# Patient Record
Sex: Male | Born: 1945 | Race: White | Hispanic: No | Marital: Single | Smoking: Never smoker
Health system: Southern US, Community
[De-identification: ages and names within clinical notes are randomized; demographics above are authoritative.]

## PROBLEM LIST (undated history)

## (undated) DIAGNOSIS — N400 Enlarged prostate without lower urinary tract symptoms: Secondary | ICD-10-CM

## (undated) DIAGNOSIS — M1990 Unspecified osteoarthritis, unspecified site: Secondary | ICD-10-CM

## (undated) DIAGNOSIS — I10 Essential (primary) hypertension: Secondary | ICD-10-CM

## (undated) DIAGNOSIS — M5126 Other intervertebral disc displacement, lumbar region: Secondary | ICD-10-CM

## (undated) DIAGNOSIS — R519 Headache, unspecified: Secondary | ICD-10-CM

## (undated) DIAGNOSIS — R51 Headache: Secondary | ICD-10-CM

## (undated) DIAGNOSIS — E7800 Pure hypercholesterolemia, unspecified: Secondary | ICD-10-CM

## (undated) DIAGNOSIS — K219 Gastro-esophageal reflux disease without esophagitis: Secondary | ICD-10-CM

## (undated) DIAGNOSIS — E039 Hypothyroidism, unspecified: Secondary | ICD-10-CM

## (undated) DIAGNOSIS — R0600 Dyspnea, unspecified: Secondary | ICD-10-CM

## (undated) HISTORY — PX: BUNIONECTOMY: SHX129

---

## 1992-11-10 HISTORY — PX: BACK SURGERY: SHX140

## 2009-11-10 HISTORY — PX: ROTATOR CUFF REPAIR: SHX139

## 2018-02-25 ENCOUNTER — Other Ambulatory Visit: Payer: Self-pay | Admitting: Neurosurgery

## 2018-03-01 NOTE — Pre-Procedure Instructions (Signed)
Nicholas LothLeon Gray Mason  03/01/2018      [facility] [id number] - [place], [place] - [address] [place] [address] [place] [id number] Phone: [phone] Fax: [phone]    Your procedure is scheduled on 03/12/2018.  [facility] at 0530 A.M.  Call this number if you have problems the morning of surgery:  [phone]   Remember:  Do not eat food or drink liquids after midnight.   Continue all medications as directed by your physician except follow these medication instructions before surgery below   Take these medicines the morning of surgery with A SIP OF WATER: Amlodipine (Norvasc) Baclofen (Lioresal) Levothyroxine (Synthroid) omemprazole (Prilosec) Oxycodone-acetaminophen (Percocet) - if needed  7 days prior to surgery STOP taking any diclofenac sodium (Voltaren) gel, Aspirin (unless otherwise instructed by your surgeon), Aleve, Naproxen, Ibuprofen, Motrin, Advil, Goody's, BC's, all herbal medications, fish oil, and all vitamins    Do not wear jewelry.  Do not wear lotions, powders, or colognes, or deodorant.  Men may shave face and neck.  Do not bring valuables to the hospital.  [facility] is not responsible for any belongings or valuables.  Hearing Aids, eyeglasses, contacts, dentures or bridgework may not be worn into surgery.  Leave your suitcase in the car.  After surgery it may be brought to your room.  For patients admitted to the hospital, discharge time will be determined by your treatment team.  Patients discharged the day of surgery will not be allowed to drive home.   Name and phone number of your driver:    Special instructions:   [facility]- Preparing For Surgery  Before surgery, you can play an important role. Because skin is not sterile, your skin needs to be as free of germs as possible. You can reduce the number of germs on your skin by washing with CHG (chlorahexidine gluconate)  Soap before surgery.  CHG is an antiseptic cleaner which kills germs and bonds with the skin to continue killing germs even after washing.  Please do not use if you have an allergy to CHG or antibacterial soaps. If your skin becomes reddened/irritated stop using the CHG.  Do not shave (including legs and underarms) for at least 48 hours prior to first CHG shower. It is OK to shave your face.  Please follow these instructions carefully.   1. Shower the NIGHT BEFORE SURGERY and the MORNING OF SURGERY with CHG.   2. If you chose to wash your hair, wash your hair first as usual with your normal shampoo.  3. After you shampoo, rinse your hair and body thoroughly to remove the shampoo.  4. Use CHG as you would any other liquid soap. You can apply CHG directly to the skin and wash gently with a scrungie or a clean washcloth.   5. Apply the CHG Soap to your body ONLY FROM THE NECK DOWN.  Do not use on open wounds or open sores. Avoid contact with your eyes, ears, mouth and genitals (private parts). Wash Face and genitals (private parts)  with your normal soap.  6. Wash thoroughly, paying special attention to the area where your surgery will be performed.  7. Thoroughly rinse your body with warm water from the neck down.  8. DO NOT shower/wash with your normal soap after using and rinsing off the CHG Soap.  9. Pat yourself dry with a CLEAN TOWEL.  10. Wear CLEAN PAJAMAS  to bed the night before surgery, wear comfortable clothes the morning of surgery  11. Place CLEAN SHEETS on your bed the night of your first shower and DO NOT SLEEP WITH PETS.    Day of Surgery: Shower as stated above. Do not apply any deodorants/lotions. Please wear clean clothes to the hospital/surgery center.      Please read over the following fact sheets that you were given.

## 2018-03-02 ENCOUNTER — Encounter (HOSPITAL_COMMUNITY)
Admission: RE | Admit: 2018-03-02 | Discharge: 2018-03-02 | Disposition: A | Discharge status: Home / Self Care | Payer: Worker's Compensation | Source: Ambulatory Visit | Attending: Neurosurgery | Admitting: Neurosurgery

## 2018-03-02 ENCOUNTER — Encounter (HOSPITAL_COMMUNITY): Payer: Self-pay

## 2018-03-02 ENCOUNTER — Other Ambulatory Visit: Payer: Self-pay

## 2018-03-02 DIAGNOSIS — Z01810 Encounter for preprocedural cardiovascular examination: Secondary | ICD-10-CM | POA: Insufficient documentation

## 2018-03-02 DIAGNOSIS — Z01812 Encounter for preprocedural laboratory examination: Secondary | ICD-10-CM | POA: Diagnosis not present

## 2018-03-02 DIAGNOSIS — I10 Essential (primary) hypertension: Secondary | ICD-10-CM | POA: Insufficient documentation

## 2018-03-02 HISTORY — DX: Hypothyroidism, unspecified: E03.9

## 2018-03-02 HISTORY — DX: Headache, unspecified: R51.9

## 2018-03-02 HISTORY — DX: Pure hypercholesterolemia, unspecified: E78.00

## 2018-03-02 HISTORY — DX: Headache: R51

## 2018-03-02 HISTORY — DX: Unspecified osteoarthritis, unspecified site: M19.90

## 2018-03-02 HISTORY — DX: Essential (primary) hypertension: I10

## 2018-03-02 HISTORY — DX: Gastro-esophageal reflux disease without esophagitis: K21.9

## 2018-03-02 LAB — TYPE AND SCREEN
ABO/RH(D): O POS
Antibody Screen: NEGATIVE

## 2018-03-02 LAB — BASIC METABOLIC PANEL
ANION GAP: 9 (ref 5–15)
BUN: 12 mg/dL (ref 6–20)
CALCIUM: 9.2 mg/dL (ref 8.9–10.3)
CO2: 25 mmol/L (ref 22–32)
CREATININE: 0.91 mg/dL (ref 0.61–1.24)
Chloride: 106 mmol/L (ref 101–111)
GFR calc Af Amer: >60 mL/min (ref >60)
GLUCOSE: 107 mg/dL — AB (ref 65–99)
Potassium: 4 mmol/L (ref 3.5–5.1)
Sodium: 140 mmol/L (ref 135–145)

## 2018-03-02 LAB — CBC
HCT: 42.3 % (ref 39.0–52.0)
HEMOGLOBIN: 13.7 g/dL (ref 13.0–17.0)
MCH: 28.7 pg (ref 26.0–34.0)
MCHC: 32.4 g/dL (ref 30.0–36.0)
MCV: 88.5 fL (ref 78.0–100.0)
PLATELETS: 271 10*3/uL (ref 150–400)
RBC: 4.78 MIL/uL (ref 4.22–5.81)
RDW: 13.6 % (ref 11.5–15.5)
WBC: 7 10*3/uL (ref 4.0–10.5)

## 2018-03-02 LAB — SURGICAL PCR SCREEN
MRSA, PCR: NEGATIVE
STAPHYLOCOCCUS AUREUS: NEGATIVE

## 2018-03-02 LAB — ABO/RH: ABO/RH(D): O POS

## 2018-03-02 NOTE — Progress Notes (Signed)
PCP - Dr. Eston [facility]ouldSolman at [facility] Cardiologist - patient denies  Chest x-ray - n/a EKG - 03/02/2018 Stress Test - patient unsure, states if he had one it was back in the early 1990's ECHO - patient unsure, states if he had one it was back in the early 1990's Cardiac Cath - patient denies  Sleep Study - patient denies  Blood Thinner Instructions: n/a Aspirin Instructions:n/a  Anesthesia review: n/a  Patient denies shortness of breath, fever, cough and chest pain at PAT appointment   Patient verbalized understanding of instructions that were given to them at the PAT appointment. Patient was also instructed that they will need to review over the PAT instructions again at home before surgery.

## 2018-03-11 NOTE — Anesthesia Preprocedure Evaluation (Addendum)
Anesthesia Evaluation  Patient identified by MRN, date of birth, ID band Patient awake    Reviewed: Allergy & Precautions, H&P , NPO status , Patient's Chart, lab work & pertinent test results  Airway Mallampati: III  TM Distance: >3 FB Neck ROM: Full    Dental no notable dental hx. (+) Edentulous Upper, Edentulous Lower, Dental Advisory Given   Pulmonary neg pulmonary ROS,    Pulmonary exam normal breath sounds clear to auscultation       Cardiovascular Exercise Tolerance: Good hypertension, Pt. on medications  Rhythm:Regular Rate:Normal     Neuro/Psych  Headaches, negative psych ROS   GI/Hepatic Neg liver ROS, GERD  Medicated and Controlled,  Endo/Other  Hypothyroidism Morbid obesity  Renal/GU negative Renal ROS  negative genitourinary   Musculoskeletal  (+) Arthritis , Osteoarthritis,    Abdominal   Peds  Hematology negative hematology ROS (+)   Anesthesia Other Findings   Reproductive/Obstetrics negative OB ROS                            Anesthesia Physical Anesthesia Plan  ASA: III  Anesthesia Plan: General   Post-op Pain Management:    Induction: Intravenous  PONV Risk Score and Plan: 3 and Ondansetron, Dexamethasone and Midazolam  Airway Management Planned: Oral ETT  Additional Equipment:   Intra-op Plan:   Post-operative Plan: Extubation in OR  Informed Consent: I have reviewed the patients History and Physical, chart, labs and discussed the procedure including the risks, benefits and alternatives for the proposed anesthesia with the patient or authorized representative who has indicated his/her understanding and acceptance.   Dental advisory given  Plan Discussed with: CRNA  Anesthesia Plan Comments:         Anesthesia Quick Evaluation

## 2018-03-12 ENCOUNTER — Encounter (HOSPITAL_COMMUNITY)
Admission: RE | Disposition: A | Discharge status: Home Health | Payer: Self-pay | Source: Ambulatory Visit | Attending: Neurosurgery

## 2018-03-12 ENCOUNTER — Other Ambulatory Visit: Payer: Self-pay

## 2018-03-12 ENCOUNTER — Inpatient Hospital Stay (HOSPITAL_COMMUNITY): Payer: Worker's Compensation | Admitting: Anesthesiology

## 2018-03-12 ENCOUNTER — Inpatient Hospital Stay (HOSPITAL_COMMUNITY): Payer: Worker's Compensation

## 2018-03-12 ENCOUNTER — Encounter (HOSPITAL_COMMUNITY): Payer: Self-pay

## 2018-03-12 ENCOUNTER — Inpatient Hospital Stay (HOSPITAL_COMMUNITY)
Admission: RE | Admit: 2018-03-12 | Discharge: 2018-03-15 | DRG: 455 | Disposition: A | Discharge status: Home Health | Payer: Worker's Compensation | Source: Ambulatory Visit | Attending: Neurosurgery | Admitting: Neurosurgery

## 2018-03-12 DIAGNOSIS — E7800 Pure hypercholesterolemia, unspecified: Secondary | ICD-10-CM | POA: Diagnosis present

## 2018-03-12 DIAGNOSIS — E039 Hypothyroidism, unspecified: Secondary | ICD-10-CM | POA: Diagnosis present

## 2018-03-12 DIAGNOSIS — M4316 Spondylolisthesis, lumbar region: Principal | ICD-10-CM

## 2018-03-12 DIAGNOSIS — K08109 Complete loss of teeth, unspecified cause, unspecified class: Secondary | ICD-10-CM | POA: Diagnosis present

## 2018-03-12 DIAGNOSIS — M1990 Unspecified osteoarthritis, unspecified site: Secondary | ICD-10-CM | POA: Diagnosis present

## 2018-03-12 DIAGNOSIS — Z79890 Hormone replacement therapy (postmenopausal): Secondary | ICD-10-CM | POA: Diagnosis not present

## 2018-03-12 DIAGNOSIS — M48062 Spinal stenosis, lumbar region with neurogenic claudication: Secondary | ICD-10-CM | POA: Diagnosis present

## 2018-03-12 DIAGNOSIS — K219 Gastro-esophageal reflux disease without esophagitis: Secondary | ICD-10-CM | POA: Diagnosis present

## 2018-03-12 DIAGNOSIS — I10 Essential (primary) hypertension: Secondary | ICD-10-CM | POA: Diagnosis present

## 2018-03-12 DIAGNOSIS — Z79899 Other long term (current) drug therapy: Secondary | ICD-10-CM | POA: Diagnosis not present

## 2018-03-12 DIAGNOSIS — Z419 Encounter for procedure for purposes other than remedying health state, unspecified: Secondary | ICD-10-CM

## 2018-03-12 HISTORY — DX: Other intervertebral disc displacement, lumbar region: M51.26

## 2018-03-12 SURGERY — POSTERIOR LUMBAR FUSION 1 LEVEL
Anesthesia: General | Site: Back

## 2018-03-12 MED ORDER — ACETAMINOPHEN 325 MG PO TABS
650.0000 mg | ORAL_TABLET | ORAL | Status: DC | PRN
  Filled 2018-03-12: qty 2

## 2018-03-12 MED ORDER — DEXAMETHASONE SODIUM PHOSPHATE 10 MG/ML IJ SOLN
INTRAMUSCULAR | Status: AC
  Administered 2018-03-12: 10 mg via INTRAVENOUS
  Filled 2018-03-12: qty 1

## 2018-03-12 MED ORDER — SODIUM CHLORIDE 0.9 % IV SOLN: 250.0000 mL | INTRAVENOUS | Status: DC

## 2018-03-12 MED ORDER — PHENOL 1.4 % MT LIQD: 1.0000 | OROMUCOSAL | Status: DC | PRN

## 2018-03-12 MED ORDER — SODIUM CHLORIDE 0.9% FLUSH
3.0000 mL | Freq: Two times a day (BID) | INTRAVENOUS | Status: DC
  Administered 2018-03-13 – 2018-03-15 (×3): 3 mL via INTRAVENOUS

## 2018-03-12 MED ORDER — ALUM & MAG HYDROXIDE-SIMETH 200-200-20 MG/5ML PO SUSP: 30.0000 mL | Freq: Four times a day (QID) | ORAL | Status: DC | PRN

## 2018-03-12 MED ORDER — HYDROMORPHONE HCL 2 MG/ML IJ SOLN
0.2500 mg | INTRAMUSCULAR | Status: DC | PRN
  Administered 2018-03-12 (×4): 0.5 mg via INTRAVENOUS

## 2018-03-12 MED ORDER — HYDROMORPHONE HCL 2 MG/ML IJ SOLN
INTRAMUSCULAR | Status: AC
  Administered 2018-03-12: 0.5 mg via INTRAVENOUS
  Filled 2018-03-12: qty 1

## 2018-03-12 MED ORDER — ONDANSETRON HCL 4 MG/2ML IJ SOLN
INTRAMUSCULAR | Status: AC
  Administered 2018-03-12: 4 mg via INTRAVENOUS
  Filled 2018-03-12: qty 2

## 2018-03-12 MED ORDER — ACETAMINOPHEN 10 MG/ML IV SOLN
1000.0000 mg | Freq: Once | INTRAVENOUS | Status: AC
  Administered 2018-03-12: 1000 mg via INTRAVENOUS

## 2018-03-12 MED ORDER — CYCLOBENZAPRINE HCL 10 MG PO TABS
ORAL_TABLET | ORAL | Status: AC
  Filled 2018-03-12: qty 1

## 2018-03-12 MED ORDER — ACETAMINOPHEN 650 MG RE SUPP: 650.0000 mg | RECTAL | Status: DC | PRN

## 2018-03-12 MED ORDER — OXYCODONE HCL 5 MG PO TABS
10.0000 mg | ORAL_TABLET | ORAL | Status: DC | PRN
  Administered 2018-03-12: 10 mg via ORAL

## 2018-03-12 MED ORDER — DEXAMETHASONE SODIUM PHOSPHATE 10 MG/ML IJ SOLN
INTRAMUSCULAR | Status: AC
  Filled 2018-03-12: qty 1

## 2018-03-12 MED ORDER — HYDROMORPHONE HCL 1 MG/ML IJ SOLN
INTRAMUSCULAR | Status: DC | PRN
  Administered 2018-03-12 (×2): .25 mg via INTRAVENOUS

## 2018-03-12 MED ORDER — ONDANSETRON HCL 4 MG/2ML IJ SOLN
INTRAMUSCULAR | Status: DC | PRN
  Administered 2018-03-12: 4 mg via INTRAVENOUS

## 2018-03-12 MED ORDER — ONDANSETRON HCL 4 MG PO TABS: 4.0000 mg | ORAL_TABLET | Freq: Four times a day (QID) | ORAL | Status: DC | PRN

## 2018-03-12 MED ORDER — FENTANYL CITRATE (PF) 100 MCG/2ML IJ SOLN
INTRAMUSCULAR | Status: DC | PRN
  Administered 2018-03-12: 100 ug via INTRAVENOUS
  Administered 2018-03-12 (×8): 50 ug via INTRAVENOUS

## 2018-03-12 MED ORDER — HYDROMORPHONE HCL 1 MG/ML IJ SOLN
INTRAMUSCULAR | Status: AC
  Filled 2018-03-12: qty 0.5

## 2018-03-12 MED ORDER — LIDOCAINE-EPINEPHRINE 1 %-1:100000 IJ SOLN
INTRAMUSCULAR | Status: AC
  Filled 2018-03-12: qty 1

## 2018-03-12 MED ORDER — FENTANYL CITRATE (PF) 250 MCG/5ML IJ SOLN
INTRAMUSCULAR | Status: AC
  Filled 2018-03-12: qty 5

## 2018-03-12 MED ORDER — MIDAZOLAM HCL 2 MG/2ML IJ SOLN
INTRAMUSCULAR | Status: AC
  Administered 2018-03-12: 1 mg via INTRAVENOUS
  Filled 2018-03-12: qty 2

## 2018-03-12 MED ORDER — 0.9 % SODIUM CHLORIDE (POUR BTL) OPTIME
TOPICAL | Status: DC | PRN
  Administered 2018-03-12 (×2): 1000 mL

## 2018-03-12 MED ORDER — LIDOCAINE HCL (CARDIAC) PF 100 MG/5ML IV SOSY
PREFILLED_SYRINGE | INTRAVENOUS | Status: DC | PRN
  Administered 2018-03-12: 60 mg via INTRAVENOUS

## 2018-03-12 MED ORDER — DEXAMETHASONE SODIUM PHOSPHATE 10 MG/ML IJ SOLN: 10.0000 mg | INTRAMUSCULAR | Status: DC

## 2018-03-12 MED ORDER — OXYCODONE HCL 5 MG PO TABS
15.0000 mg | ORAL_TABLET | ORAL | Status: DC | PRN
  Administered 2018-03-12 – 2018-03-15 (×12): 15 mg via ORAL
  Filled 2018-03-12 (×12): qty 3

## 2018-03-12 MED ORDER — DEXAMETHASONE SODIUM PHOSPHATE 10 MG/ML IJ SOLN
10.0000 mg | Freq: Once | INTRAMUSCULAR | Status: AC
  Administered 2018-03-12: 10 mg via INTRAVENOUS

## 2018-03-12 MED ORDER — ACETAMINOPHEN 10 MG/ML IV SOLN
INTRAVENOUS | Status: AC
  Filled 2018-03-12: qty 100

## 2018-03-12 MED ORDER — CHLORHEXIDINE GLUCONATE CLOTH 2 % EX PADS: 6.0000 | MEDICATED_PAD | Freq: Once | CUTANEOUS | Status: DC

## 2018-03-12 MED ORDER — CEFAZOLIN SODIUM-DEXTROSE 2-4 GM/100ML-% IV SOLN
2.0000 g | INTRAVENOUS | Status: AC
  Administered 2018-03-12: 2 g via INTRAVENOUS

## 2018-03-12 MED ORDER — BUPIVACAINE LIPOSOME 1.3 % IJ SUSP
20.0000 mL | INTRAMUSCULAR | Status: DC
  Filled 2018-03-12 (×2): qty 20

## 2018-03-12 MED ORDER — PROPOFOL 10 MG/ML IV BOLUS
INTRAVENOUS | Status: AC
  Filled 2018-03-12: qty 20

## 2018-03-12 MED ORDER — THROMBIN 20000 UNITS EX SOLR
CUTANEOUS | Status: AC
  Filled 2018-03-12: qty 20000

## 2018-03-12 MED ORDER — SODIUM CHLORIDE 0.9 % IV SOLN
INTRAVENOUS | Status: DC | PRN
  Administered 2018-03-12: 07:00:00

## 2018-03-12 MED ORDER — VANCOMYCIN HCL 1000 MG IV SOLR
INTRAVENOUS | Status: AC
  Filled 2018-03-12: qty 1000

## 2018-03-12 MED ORDER — ROCURONIUM BROMIDE 50 MG/5ML IV SOLN
INTRAVENOUS | Status: AC
  Filled 2018-03-12: qty 3

## 2018-03-12 MED ORDER — HYDROMORPHONE HCL 1 MG/ML IJ SOLN
1.0000 mg | INTRAMUSCULAR | Status: DC | PRN
  Administered 2018-03-12 (×2): 1 mg via INTRAVENOUS
  Filled 2018-03-12 (×2): qty 1

## 2018-03-12 MED ORDER — DEXAMETHASONE SODIUM PHOSPHATE 10 MG/ML IJ SOLN: 10.0000 mg | Freq: Four times a day (QID) | INTRAMUSCULAR | Status: DC

## 2018-03-12 MED ORDER — MIDAZOLAM HCL 2 MG/2ML IJ SOLN
INTRAMUSCULAR | Status: AC
  Filled 2018-03-12: qty 2

## 2018-03-12 MED ORDER — THROMBIN 5000 UNITS EX SOLR
CUTANEOUS | Status: AC
  Filled 2018-03-12: qty 5000

## 2018-03-12 MED ORDER — DEXAMETHASONE SODIUM PHOSPHATE 10 MG/ML IJ SOLN
INTRAMUSCULAR | Status: DC | PRN
  Administered 2018-03-12: 10 mg via INTRAVENOUS

## 2018-03-12 MED ORDER — MIDAZOLAM HCL 2 MG/2ML IJ SOLN
1.0000 mg | Freq: Once | INTRAMUSCULAR | Status: AC
  Administered 2018-03-12: 1 mg via INTRAVENOUS

## 2018-03-12 MED ORDER — ROCURONIUM BROMIDE 100 MG/10ML IV SOLN
INTRAVENOUS | Status: DC | PRN
  Administered 2018-03-12: 10 mg via INTRAVENOUS
  Administered 2018-03-12 (×2): 20 mg via INTRAVENOUS
  Administered 2018-03-12: 10 mg via INTRAVENOUS
  Administered 2018-03-12: 50 mg via INTRAVENOUS
  Administered 2018-03-12: 30 mg via INTRAVENOUS

## 2018-03-12 MED ORDER — METHOCARBAMOL 1000 MG/10ML IJ SOLN
500.0000 mg | Freq: Once | INTRAVENOUS | Status: AC
  Administered 2018-03-12: 500 mg via INTRAVENOUS
  Filled 2018-03-12: qty 5

## 2018-03-12 MED ORDER — CEFAZOLIN SODIUM-DEXTROSE 2-4 GM/100ML-% IV SOLN
2.0000 g | Freq: Three times a day (TID) | INTRAVENOUS | Status: AC
  Administered 2018-03-12 – 2018-03-14 (×6): 2 g via INTRAVENOUS
  Filled 2018-03-12 (×7): qty 100

## 2018-03-12 MED ORDER — SUGAMMADEX SODIUM 200 MG/2ML IV SOLN
INTRAVENOUS | Status: DC | PRN
  Administered 2018-03-12: 250 mg via INTRAVENOUS

## 2018-03-12 MED ORDER — ALBUMIN HUMAN 5 % IV SOLN
INTRAVENOUS | Status: DC | PRN
  Administered 2018-03-12: 11:00:00 via INTRAVENOUS

## 2018-03-12 MED ORDER — LIDOCAINE-EPINEPHRINE 1 %-1:100000 IJ SOLN
INTRAMUSCULAR | Status: DC | PRN
  Administered 2018-03-12: 9 mL

## 2018-03-12 MED ORDER — MENTHOL 3 MG MT LOZG: 1.0000 | LOZENGE | OROMUCOSAL | Status: DC | PRN

## 2018-03-12 MED ORDER — OXYCODONE HCL 5 MG PO TABS
ORAL_TABLET | ORAL | Status: AC
  Administered 2018-03-12: 15 mg via ORAL
  Filled 2018-03-12: qty 2

## 2018-03-12 MED ORDER — CYCLOBENZAPRINE HCL 10 MG PO TABS
10.0000 mg | ORAL_TABLET | Freq: Three times a day (TID) | ORAL | Status: DC | PRN
  Administered 2018-03-12: 10 mg via ORAL

## 2018-03-12 MED ORDER — PROPOFOL 10 MG/ML IV BOLUS
INTRAVENOUS | Status: DC | PRN
  Administered 2018-03-12: 100 mg via INTRAVENOUS

## 2018-03-12 MED ORDER — METHOCARBAMOL 1000 MG/10ML IJ SOLN
1000.0000 mg | Freq: Four times a day (QID) | INTRAMUSCULAR | Status: DC | PRN
  Administered 2018-03-14 (×2): 1000 mg via INTRAVENOUS
  Filled 2018-03-12 (×3): qty 10

## 2018-03-12 MED ORDER — LACTATED RINGERS IV SOLN
INTRAVENOUS | Status: DC | PRN
  Administered 2018-03-12 (×3): via INTRAVENOUS

## 2018-03-12 MED ORDER — FAMOTIDINE IN NACL 20-0.9 MG/50ML-% IV SOLN
20.0000 mg | Freq: Two times a day (BID) | INTRAVENOUS | Status: DC
  Administered 2018-03-12 – 2018-03-14 (×4): 20 mg via INTRAVENOUS
  Filled 2018-03-12 (×4): qty 50

## 2018-03-12 MED ORDER — ONDANSETRON HCL 4 MG/2ML IJ SOLN
4.0000 mg | Freq: Four times a day (QID) | INTRAMUSCULAR | Status: DC | PRN
  Administered 2018-03-12 – 2018-03-14 (×2): 4 mg via INTRAVENOUS
  Filled 2018-03-12: qty 2

## 2018-03-12 MED ORDER — THROMBIN (RECOMBINANT) 20000 UNITS EX SOLR
CUTANEOUS | Status: DC | PRN
  Administered 2018-03-12: 07:00:00 via TOPICAL

## 2018-03-12 MED ORDER — MIDAZOLAM HCL 5 MG/5ML IJ SOLN
INTRAMUSCULAR | Status: DC | PRN
  Administered 2018-03-12 (×2): 1 mg via INTRAVENOUS

## 2018-03-12 MED ORDER — CEFAZOLIN SODIUM-DEXTROSE 2-4 GM/100ML-% IV SOLN
INTRAVENOUS | Status: AC
  Filled 2018-03-12: qty 100

## 2018-03-12 MED ORDER — SODIUM CHLORIDE 0.9% FLUSH: 3.0000 mL | INTRAVENOUS | Status: DC | PRN

## 2018-03-12 MED ORDER — BUPIVACAINE HCL (PF) 0.25 % IJ SOLN
INTRAMUSCULAR | Status: AC
  Filled 2018-03-12: qty 30

## 2018-03-12 MED ORDER — VANCOMYCIN HCL 1 G IV SOLR
INTRAVENOUS | Status: DC | PRN
  Administered 2018-03-12: 1000 mg

## 2018-03-12 SURGICAL SUPPLY — 76 items
BAG DECANTER FOR FLEXI CONT (MISCELLANEOUS) ×3 IMPLANT
BENZOIN TINCTURE PRP APPL 2/3 (GAUZE/BANDAGES/DRESSINGS) ×3 IMPLANT
BLADE CLIPPER SURG (BLADE) IMPLANT
BLADE SURG 11 STRL SS (BLADE) ×3 IMPLANT
BONE VIVIGEN FORMABLE 5.4CC (Bone Implant) ×3 IMPLANT
BUR CUTTER 7.0 ROUND (BURR) ×3 IMPLANT
BUR MATCHSTICK NEURO 3.0 LAGG (BURR) ×3 IMPLANT
CAGE RISE 11-17-15 10X22 (Cage) ×6 IMPLANT
CANISTER SUCT 3000ML PPV (MISCELLANEOUS) ×3 IMPLANT
CAP LOCKING THREADED (Cap) ×12 IMPLANT
CARTRIDGE OIL MAESTRO DRILL (MISCELLANEOUS) ×1 IMPLANT
CLOSURE WOUND 1/2 X4 (GAUZE/BANDAGES/DRESSINGS) ×1
CONT SPEC 4OZ CLIKSEAL STRL BL (MISCELLANEOUS) ×3 IMPLANT
COVER BACK TABLE 60X90IN (DRAPES) ×3 IMPLANT
DECANTER SPIKE VIAL GLASS SM (MISCELLANEOUS) ×3 IMPLANT
DERMABOND ADVANCED (GAUZE/BANDAGES/DRESSINGS) ×2
DERMABOND ADVANCED .7 DNX12 (GAUZE/BANDAGES/DRESSINGS) ×1 IMPLANT
DIFFUSER DRILL AIR PNEUMATIC (MISCELLANEOUS) ×3 IMPLANT
DRAPE C-ARM 42X72 X-RAY (DRAPES) ×6 IMPLANT
DRAPE C-ARMOR (DRAPES) IMPLANT
DRAPE HALF SHEET 40X57 (DRAPES) IMPLANT
DRAPE LAPAROTOMY 100X72X124 (DRAPES) ×3 IMPLANT
DRAPE SURG 17X23 STRL (DRAPES) ×3 IMPLANT
DRSG OPSITE 4X5.5 SM (GAUZE/BANDAGES/DRESSINGS) ×3 IMPLANT
DRSG OPSITE POSTOP 4X6 (GAUZE/BANDAGES/DRESSINGS) ×3 IMPLANT
DURAPREP 26ML APPLICATOR (WOUND CARE) ×3 IMPLANT
ELECT BLADE 4.0 EZ CLEAN MEGAD (MISCELLANEOUS) ×3
ELECT REM PT RETURN 9FT ADLT (ELECTROSURGICAL) ×3
ELECTRODE BLDE 4.0 EZ CLN MEGD (MISCELLANEOUS) ×1 IMPLANT
ELECTRODE REM PT RTRN 9FT ADLT (ELECTROSURGICAL) ×1 IMPLANT
EVACUATOR 3/16  PVC DRAIN (DRAIN)
EVACUATOR 3/16 PVC DRAIN (DRAIN) IMPLANT
FIBER BOAT ALLOFUSE 7.5CC (Bone Implant) ×3 IMPLANT
GAUZE SPONGE 4X4 12PLY STRL (GAUZE/BANDAGES/DRESSINGS) ×3 IMPLANT
GAUZE SPONGE 4X4 16PLY XRAY LF (GAUZE/BANDAGES/DRESSINGS) ×3 IMPLANT
GLOVE BIO SURGEON STRL SZ7 (GLOVE) ×6 IMPLANT
GLOVE BIO SURGEON STRL SZ8 (GLOVE) ×6 IMPLANT
GLOVE BIOGEL PI IND STRL 6.5 (GLOVE) ×2 IMPLANT
GLOVE BIOGEL PI IND STRL 7.0 (GLOVE) IMPLANT
GLOVE BIOGEL PI INDICATOR 6.5 (GLOVE) ×4
GLOVE BIOGEL PI INDICATOR 7.0 (GLOVE)
GLOVE ECLIPSE 7.5 STRL STRAW (GLOVE) IMPLANT
GLOVE EXAM NITRILE LRG STRL (GLOVE) IMPLANT
GLOVE EXAM NITRILE XL STR (GLOVE) IMPLANT
GLOVE EXAM NITRILE XS STR PU (GLOVE) IMPLANT
GLOVE INDICATOR 8.5 STRL (GLOVE) ×6 IMPLANT
GLOVE SURG SS PI 6.0 STRL IVOR (GLOVE) ×9 IMPLANT
GOWN STRL REUS W/ TWL LRG LVL3 (GOWN DISPOSABLE) ×5 IMPLANT
GOWN STRL REUS W/ TWL XL LVL3 (GOWN DISPOSABLE) ×2 IMPLANT
GOWN STRL REUS W/TWL 2XL LVL3 (GOWN DISPOSABLE) IMPLANT
GOWN STRL REUS W/TWL LRG LVL3 (GOWN DISPOSABLE) ×10
GOWN STRL REUS W/TWL XL LVL3 (GOWN DISPOSABLE) ×4
HEMOSTAT POWDER KIT SURGIFOAM (HEMOSTASIS) IMPLANT
KIT BASIN OR (CUSTOM PROCEDURE TRAY) ×3 IMPLANT
KIT TURNOVER KIT B (KITS) ×3 IMPLANT
MILL MEDIUM DISP (BLADE) ×3 IMPLANT
NEEDLE HYPO 25X1 1.5 SAFETY (NEEDLE) ×3 IMPLANT
NS IRRIG 1000ML POUR BTL (IV SOLUTION) ×3 IMPLANT
OIL CARTRIDGE MAESTRO DRILL (MISCELLANEOUS) ×3
PACK LAMINECTOMY NEURO (CUSTOM PROCEDURE TRAY) ×3 IMPLANT
PAD ARMBOARD 7.5X6 YLW CONV (MISCELLANEOUS) ×9 IMPLANT
ROD 40MM SPINAL (Rod) ×6 IMPLANT
SCREW AMP MODULAR CREO 6.5X45 (Screw) ×6 IMPLANT
SCREW CREO 6.5X40 (Screw) ×6 IMPLANT
SCREW PA THRD CREO TULIP 5.5X4 (Head) ×12 IMPLANT
SPONGE LAP 4X18 X RAY DECT (DISPOSABLE) IMPLANT
SPONGE SURGIFOAM ABS GEL 100 (HEMOSTASIS) ×3 IMPLANT
STRIP CLOSURE SKIN 1/2X4 (GAUZE/BANDAGES/DRESSINGS) ×2 IMPLANT
SUT VIC AB 0 CT1 18XCR BRD8 (SUTURE) ×2 IMPLANT
SUT VIC AB 0 CT1 8-18 (SUTURE) ×4
SUT VIC AB 2-0 CT1 18 (SUTURE) ×3 IMPLANT
SUT VIC AB 4-0 PS2 27 (SUTURE) ×3 IMPLANT
TOWEL GREEN STERILE (TOWEL DISPOSABLE) ×3 IMPLANT
TOWEL GREEN STERILE FF (TOWEL DISPOSABLE) ×3 IMPLANT
TRAY FOLEY MTR SLVR 16FR STAT (SET/KITS/TRAYS/PACK) ×3 IMPLANT
WATER STERILE IRR 1000ML POUR (IV SOLUTION) ×3 IMPLANT

## 2018-03-12 NOTE — Anesthesia Procedure Notes (Signed)
Procedure Name: Intubation Date/Time: 03/12/2018 7:50 AM Performed by: Lanell Matar, CRNA Pre-anesthesia Checklist: Patient identified, Emergency Drugs available, Suction available and Patient being monitored Patient Re-evaluated:Patient Re-evaluated prior to induction Oxygen Delivery Method: Circle System Utilized Preoxygenation: Pre-oxygenation with 100% oxygen Induction Type: IV induction Ventilation: Mask ventilation without difficulty Laryngoscope Size: Miller and 2 Grade View: Grade I Tube type: Oral Number of attempts: 1 Airway Equipment and Method: Stylet and Oral airway Placement Confirmation: ETT inserted through vocal cords under direct vision,  positive ETCO2 and breath sounds checked- equal and bilateral Secured at: 21 cm Tube secured with: Tape Dental Injury: Teeth and Oropharynx as per pre-operative assessment

## 2018-03-12 NOTE — Progress Notes (Signed)
Patient arrived to unit with belongings and family at bedside.  Vitals stable, oriented to room and unit.  Admission completed.  Continue to monitor at this time.

## 2018-03-12 NOTE — Anesthesia Postprocedure Evaluation (Signed)
Anesthesia Post Note  Patient: Nicholas Mason  Procedure(s) Performed: Posterior Lumbar Interbody Fusion  - Lumbar four-Lumbar five (N/A Back)     Patient location during evaluation: PACU Anesthesia Type: General Level of consciousness: awake and alert Pain management: pain level controlled Vital Signs Assessment: post-procedure vital signs reviewed and stable Respiratory status: spontaneous breathing, nonlabored ventilation, respiratory function stable and patient connected to nasal cannula oxygen Cardiovascular status: blood pressure returned to baseline and stable Postop Assessment: no apparent nausea or vomiting Anesthetic complications: no    Last Vitals:  Vitals:   03/12/18 1408 03/12/18 1423  BP: 127/77 137/86  Pulse: 63 88  Resp: 11 (!) 22  Temp:    SpO2: 96% 96%    Last Pain:  Vitals:   03/12/18 1425  TempSrc:   PainSc: 10-Worst pain ever                 Gertha Lichtenberg,W. EDMOND

## 2018-03-12 NOTE — Transfer of Care (Signed)
Immediate Anesthesia Transfer of Care Note  Patient: Nicholas Mason  Procedure(s) Performed: Posterior Lumbar Interbody Fusion  - Lumbar four-Lumbar five (N/A Back)  Patient Location: PACU  Anesthesia Type:General  Level of Consciousness: awake, alert  and oriented  Airway & Oxygen Therapy: Patient Spontanous Breathing and Patient connected to nasal cannula oxygen  Post-op Assessment: Report given to RN, Post -op Vital signs reviewed and stable and Patient moving all extremities X 4  Post vital signs: Reviewed and stable  Last Vitals:  Vitals Value Taken Time  BP 164/111 03/12/2018 11:51 AM  Temp    Pulse 96 03/12/2018 11:53 AM  Resp 23 03/12/2018 11:53 AM  SpO2 95 % 03/12/2018 11:53 AM  Vitals shown include unvalidated device data.  Last Pain:  Vitals:   03/12/18 0607  TempSrc: Oral  PainSc: 0-No pain         Complications: No apparent anesthesia complications

## 2018-03-12 NOTE — Op Note (Signed)
Preoperative diagnosis: Grade 1 spondylolisthesis L4-5  Postoperative diagnosis: Same  Procedure: #1 Gill decompression L4-5 with complete medial facetectomies radical foraminotomies of the L4 and L5 nerve roots with drilling of partial inferior lateral aspect of the L4 pedicles decompression of the L4 nerve roots bilaterally. All this in excess and requiring more work to would be needed with a standard interbody fusion.  #2 posterior lumbar interbody fusion L4-5 utilizing the globus rise expandable cage system packed with locally harvested autograft mixed with vivigen and allostem cortical fiber boats  #3 pedicle screw fixation L4-5 utilizing the globus creole modular pedicle screw set  #4 posterior lateral arthrodesis L4-5 utilizing the locally harvested autograft mix along with cortical fiber boats  #5 open reduction spinal deformity L4-5  Surgeon: Jillyn Hidden Deontre Allsup  Asst.: Verlin Dike  Anesthesia: Gen.  EBL: Minimal  History of present illness: 72 year old male with long same back and bilateral leg pain worse on the right workup revealed severe bone-on-bone degenerative collapse grade 1 almost grade 2 spondylolisthesis with severe foraminal stenosis the L4 and L5 nerve roots. Due to patient's failure conservative treatment imaging findings and progressive clinical syndrome or recommended decompression stabilization procedure at L4-5. I extensively reviewed the risks and benefits of the operation with him as well as perioperative course expectations of outcome and alternatives of surgery and he understood and agreed to proceed forward.  Operative procedure: Patient brought into the or was induced under general anesthesia positioned prone the Wilson frame his back was prepped and draped in routine sterile fashion a midline incision was made after infiltration of 9 mL lidocaine with epi and Bovie light car was used to take down the subcutaneous tissues and subperiosteal dissections care lamina  of L4 and L5 bilaterally exposing TPs at L4 and L5 bilaterally. Interoperative x-ray confirmed good condition proper level there was marked facet arthropathy and extra facet synovial cyst. This is all removed spinous processes and removed central decompression was begun with a high-speed drill and a 3 mm Kerrison punch. Ligament flavum was identified and removed in piecemeal fashion. Complete medial facetectomies were performed and this allowed identification of the L5 pedicles and aggressive undercutting the superior tickling facet marching superiorly identify the L4 pedicle drilled off the inferolateral aspect of both L4 pedicles and aggressively decompressed the L4 nerve root in the foramen. After adequate facetectomies and foraminotomy was were achieved attention to the interbody work utilizing bipolar light cautery epidural veins currently disc space was incised bilaterally sequential distraction starting with a 5 mm distractor working up to 11 open up the disc space and significantly reduce the deformity. This also indirectly decompressed the 4 roots additionally. Then with 11 distractor in place endplates were prepared disc space was cleaned out and 1117 expandable cage with 15 lordosis was packed with local autograft mix and inserted and expanded. Then the distractor was removed in its similar fashion the central disc and contralateral side was prepared end plates were prepared autograft mix was packed centrally contralateral cage was inserted and both cages were sequentially expanded additionally to further reduce the deformity. After about 75% reduction in the slip was achieved attention taken to pedicle screw placement utilizing a high-speed drill pilot holes were drilled at L4 and L5 bilaterally pedicles were cannulated probed tapped with a 55 Probed again and a 6 5 x 45 screws inserted L4 bilaterally and 6 5 x 40 at L5 bilaterally. All screws excellent purchase and fluoroscopy confirmed good position  of all implants. Wounds and to see  her get meticulous in space was maintained Gelfoam was laid top of the dura the heads were reassembled and rods were placed everything was anchored in place the TPs and lateral facet complexes were aggressively decorticated and the local autograft mix along with cortical fiber boats were packed posterior laterally. Then all the foraminal reinspected confirm patency no migration of graft material Gelfoam was overlaid vancomycin sprout or was present: The wound. X Burrell was injected in the fascia. And the wounds closed with interrupted Vicryl running 4 subcuticular Dermabond benzo and Steri-Strips and sterile dressing. At the end the case all needle counts sponge counts were correct.

## 2018-03-12 NOTE — H&P (Signed)
Nicholas Mason is an 72 y.o. male.   Chief Complaint: back and right leg pain HPI: 72 year old gentleman with progressive worsening back and right leg pain and neurogenic claudication weakness. Workup has revealed a grade 1 spondylolisthesis with severe stenosis and at L4-5. Due to patient's progression of clinical syndrome imaging findings with a conservative treatment I recommended decompression stabilization procedure at L4-5. I extensively reviewed the risks and benefits of the operation with him as well as perioperative course expectations of outcome and alternatives of surgery and he understood and agreed to proceed forward.  Past Medical History:  Diagnosis Date  . Arthritis   . GERD (gastroesophageal reflux disease)   . Headache    "regular headache every now and then"  . High cholesterol   . HNP (herniated nucleus pulposus), lumbar   . Hypertension   . Hypothyroidism     Past Surgical History:  Procedure Laterality Date  . BACK SURGERY  1994   "middle back"  . BUNIONECTOMY Left   . ROTATOR CUFF REPAIR Left 2011    History reviewed. No pertinent family history. Social History:  reports that he has never smoked. He has never used smokeless tobacco. He reports that he drinks alcohol. He reports that he does not use drugs.  Allergies: No Known Allergies  Medications Prior to Admission  Medication Sig Dispense Refill  . amLODipine (NORVASC) 10 MG tablet Take 10 mg by mouth daily.    . baclofen (LIORESAL) 10 MG tablet Take 10 mg by mouth 4 (four) times daily.  2  . diclofenac sodium (VOLTAREN) 1 % GEL Apply 1 g topically 4 (four) times daily as needed for pain.  2  . ibuprofen (ADVIL,MOTRIN) 200 MG tablet Take 200-400 mg by mouth every 8 (eight) hours as needed (FOR PAIN.).    Marland Kitchen levothyroxine (SYNTHROID, LEVOTHROID) 125 MCG tablet Take 125 mcg by mouth daily before breakfast.    . lisinopril (PRINIVIL,ZESTRIL) 40 MG tablet Take 40 mg by mouth daily.    Marland Kitchen omeprazole  (PRILOSEC) 20 MG capsule Take 20 mg by mouth daily.    Marland Kitchen oxyCODONE-acetaminophen (PERCOCET) 7.5-325 MG tablet Take 1 tablet by mouth every 6 (six) hours as needed for pain.  0  . pravastatin (PRAVACHOL) 40 MG tablet Take 40 mg by mouth at bedtime.      No results found for this or any previous visit (from the past 48 hour(s)). No results found.  Review of Systems  Musculoskeletal: Positive for back pain and joint pain.  Neurological: Positive for tingling and sensory change.    Blood pressure 115/73, pulse 61, temperature 98.6 F (37 C), temperature source Oral, resp. rate 17, height 5' 7.5" (1.715 m), weight 108.9 kg (240 lb), SpO2 94 %. Physical Exam  Constitutional: He is oriented to person, place, and time. He appears well-developed and well-nourished.  HENT:  Head: Normocephalic.  Eyes: Pupils are equal, round, and reactive to light.  Neck: Normal range of motion.  Respiratory: Effort normal.  GI: Soft.  Neurological: He is alert and oriented to person, place, and time. He has normal strength. GCS eye subscore is 4. GCS verbal subscore is 5. GCS motor subscore is 6.  Strength is 5 out of 5 iliopsoas, quads, hamstrings, gastric, into tibialis, and EHL.he doesn't have some pain limited weakness without overt focal muscle weakness.  Skin: Skin is warm and dry.     Assessment/Plan  72 year old gentleman presents for an L4-5 decompression fusion.  Nicholas Mason P, MD 03/12/2018, 7:38 AM

## 2018-03-13 MED ORDER — BISACODYL 5 MG PO TBEC
10.0000 mg | DELAYED_RELEASE_TABLET | Freq: Every day | ORAL | Status: DC | PRN
  Administered 2018-03-13 – 2018-03-14 (×2): 10 mg via ORAL
  Filled 2018-03-13 (×2): qty 2

## 2018-03-13 MED ORDER — FLEET ENEMA 7-19 GM/118ML RE ENEM
1.0000 | ENEMA | Freq: Every day | RECTAL | Status: DC | PRN
  Administered 2018-03-15: 1 via RECTAL
  Filled 2018-03-13: qty 1

## 2018-03-13 NOTE — Evaluation (Signed)
Physical Therapy Evaluation Patient Details Name: Nicholas Mason MRN: [id number] DOB: 01-Nov-1946 Today's Date: 03/13/2018   History of Present Illness  Patient is a 72 yo male s/p Posterior Lumbar Interbody Fusion  - Lumbar four-Lumbar five   Clinical Impression  Orders received for PT evaluation. Patient demonstrates deficits in functional mobility as indicated below. Will benefit from continued skilled PT to address deficits and maximize function. Will see as indicated and progress as tolerated.      Follow Up Recommendations Outpatient PT(when cleared from restrictions)    Equipment Recommendations  None recommended by PT    Recommendations for Other Services       Precautions / Restrictions Precautions Precautions: Back Precaution Booklet Issued: Yes (comment) Precaution Comments: verbally reviewed spinal precautions Required Braces or Orthoses: Spinal Brace      Mobility  Bed Mobility Overal bed mobility: Needs Assistance Bed Mobility: Rolling;Sidelying to Sit Rolling: Supervision Sidelying to sit: Supervision       General bed mobility comments: VCs for positioning and technique  Transfers Overall transfer level: Needs assistance Equipment used: Rolling walker (2 wheeled) Transfers: Sit to/from Stand Sit to Stand: Min guard         General transfer comment: VCs for hand placement and positioning, increased time and effort noted  Ambulation/Gait Ambulation/Gait assistance: Supervision;Min guard Ambulation Distance (Feet): 220 Feet Assistive device: Rolling walker (2 wheeled) Gait Pattern/deviations: Step-through pattern;Decreased stride length;Drifts right/left;Antalgic Gait velocity: decreased   General Gait Details: patient with some noted instability with ambulation due to baseline RLE weakness. Improved stability but heavy reliance on UE support  Stairs            Wheelchair Mobility    Modified Rankin (Stroke Patients Only)        Balance Overall balance assessment: Mild deficits observed, not formally tested                                           Pertinent Vitals/Pain Pain Assessment: 0-10 Pain Score: 4  Pain Location: back Pain Descriptors / Indicators: Grimacing;Sore Pain Intervention(s): Monitored during session    Home Living Family/patient expects to be discharged to:: Private residence Living Arrangements: Spouse/significant other Available Help at Discharge: Family Type of Home: Mobile home Home Access: Stairs to enter Entrance Stairs-Rails: None Entrance Stairs-Number of Steps: 4 Home Layout: One level Home Equipment: Environmental consultant - 2 wheels;Cane - single point      Prior Function Level of Independence: Independent with assistive device(s)   Gait / Transfers Assistance Needed: was using a RW for mobility           Hand Dominance   Dominant Hand: Right    Extremity/Trunk Assessment   Upper Extremity Assessment Upper Extremity Assessment: Defer to OT evaluation;Generalized weakness    Lower Extremity Assessment Lower Extremity Assessment: RLE deficits/detail RLE Deficits / Details: baseline RLE deficits RLE Coordination: decreased fine motor;decreased gross motor       Communication   Communication: HOH  Cognition Arousal/Alertness: Awake/alert Behavior During Therapy: WFL for tasks assessed/performed Overall Cognitive Status: Within Functional Limits for tasks assessed                                        General Comments      Exercises     Assessment/Plan  PT Assessment Patient needs continued PT services  PT Problem List Decreased strength;Decreased activity tolerance;Decreased balance;Decreased mobility;Decreased knowledge of precautions;Pain       PT Treatment Interventions DME instruction;Gait training;Stair training;Functional mobility training;Therapeutic activities;Therapeutic exercise;Balance training;Patient/family  education    PT Goals (Current goals can be found in the Care Plan section)  Acute Rehab PT Goals Patient Stated Goal: to go home PT Goal Formulation: With patient/family Time For Goal Achievement: 03/27/18 Potential to Achieve Goals: Good    Frequency Min 5X/week   Barriers to discharge        Co-evaluation               AM-PAC PT "6 Clicks" Daily Activity  Outcome Measure Difficulty turning over in bed (including adjusting bedclothes, sheets and blankets)?: A Little Difficulty moving from lying on back to sitting on the side of the bed? : A Little Difficulty sitting down on and standing up from a chair with arms (e.g., wheelchair, bedside commode, etc,.)?: A Little Help needed moving to and from a bed to chair (including a wheelchair)?: A Little Help needed walking in hospital room?: A Little Help needed climbing 3-5 steps with a railing? : A Little 6 Click Score: 18    End of Session Equipment Utilized During Treatment: Gait belt;Back brace Activity Tolerance: Patient tolerated treatment well Patient left: in chair;with call bell/phone within reach;with family/visitor present Nurse Communication: Mobility status PT Visit Diagnosis: Difficulty in walking, not elsewhere classified (R26.2)    Time: 1610-9604 PT Time Calculation (min) (ACUTE ONLY): 19 min   Charges:   PT Evaluation $PT Eval Moderate Complexity: 1 Mod     PT G Codes:        Charlotte Crumb, PT DPT  Board Certified Neurologic Specialist [phone]   Fabio Asa 03/13/2018, 12:17 PM

## 2018-03-13 NOTE — Progress Notes (Signed)
Dr Danielle Dess in to assess patient's status.  Discussed constipation and pain management.  Will increase activity.  PT to see.

## 2018-03-13 NOTE — Social Work (Signed)
CSW acknowledging consult, per PT recommendations: Outpatient PT(when cleared from restrictions).  CSW signing off. Please consult if any additional needs arise.  Doy Hutching, LCSWA [facility] Clinical Social Work [phone]

## 2018-03-13 NOTE — Progress Notes (Signed)
Patient ID: Nicholas Mason, male   DOB: 02-19-1946, 72 y.o.   MRN: [id number] Vital signs are stable Motor function is intact Postop day 1 patient has not mobilized yet Drain is put out over 300 cc since last night Drain will remain in place today Patient is somewhat reluctant about mobilization just yet Foley catheter is out

## 2018-03-14 MED ORDER — FAMOTIDINE 20 MG PO TABS
20.0000 mg | ORAL_TABLET | Freq: Two times a day (BID) | ORAL | Status: DC
  Administered 2018-03-14 – 2018-03-15 (×2): 20 mg via ORAL
  Filled 2018-03-14 (×2): qty 1

## 2018-03-14 NOTE — Progress Notes (Signed)
Occupational Therapy Progress Note  Pt instructed in use of AE for LB ADLs and toileting.  With use of AE, pt was able to safely perform LB ADLs with min A.  Limited by pain and need to keep hands on walker when standing.  P  Recommend reacher, Long handled shoe horn, Long handled bath sponge, and toileting aid as well as a toilet riser with handles to allow him to perform ADLs as independently and safely as possible.    Recommend HHOT.     03/14/18 1700  OT Visit Information  Last OT Received On 03/14/18  Assistance Needed +1  History of Present Illness Patient is a 72 yo male s/p Posterior Lumbar Interbody Fusion  - Lumbar four-Lumbar five   Precautions  Precautions Back  Precaution Booklet Issued Yes (comment)  Precaution Comments verbally reviewed spinal precautions  Required Braces or Orthoses Spinal Brace  Pain Assessment  Pain Assessment Faces  Faces Pain Scale 8  Pain Location right knee and low back'  Pain Descriptors / Indicators Grimacing;Sore  Pain Intervention(s) Monitored during session;Patient requesting pain meds-RN notified  Cognition  Arousal/Alertness Awake/alert  Behavior During Therapy WFL for tasks assessed/performed  Overall Cognitive Status Within Functional Limits for tasks assessed  ADL  Overall ADL's  Needs assistance/impaired  Lower Body Bathing With adaptive equipment;Cueing for back precautions;Sit to/from stand;Min guard  Lower Body Bathing Details (indicate cue type and reason) Pt instructed in use of LH bath sponge   Lower Body Dressing Minimal assistance;Sit to/from stand  Lower Body Dressing Details (indicate cue type and reason) Pt instructed in use of reacher, LH shoe horn, and sock aid.  He requires assist to pull pants over hips   Toilet Transfer Min guard;Comfort height toilet;Ambulation;Grab bars;RW  Statistician Details (indicate cue type and reason) verbal cues for back precautions and walker safety   Toileting - Clothing Manipulation  Details (indicate cue type and reason) Pt and wife instructed in use of toileting aid   Functional mobility during ADLs Min guard;Rolling walker  Transfers  Overall transfer level Needs assistance  Equipment used Rolling walker (2 wheeled)  Transfers Sit to/from Stand  Sit to [employer] guard  General transfer comment verbal cues for hand placement, and min guard assist for safety   General Comments  General comments (skin integrity, edema, etc.) reviewed back precautions and safety with ADLs   OT - End of Session  Equipment Utilized During Treatment Back brace;Rolling walker;Gait belt  Activity Tolerance Patient limited by pain  Patient left in chair;with call bell/phone within reach;with family/visitor present  Nurse Communication Mobility status;Patient requests pain meds  OT Assessment/Plan  OT Plan Discharge plan remains appropriate  OT Visit Diagnosis Pain  Pain - part of body  (back )  OT Frequency (ACUTE ONLY) Min 2X/week  Follow Up Recommendations Home health OT;Supervision/Assistance - 24 hour  OT Equipment Toilet rise with handles;Other (comment) (Reacher, sock aid, LH sponge, LH shoe horn, toileting aid )  AM-PAC OT "6 Clicks" Daily Activity Outcome Measure  Help from another person eating meals? 4  Help from another person taking care of personal grooming? 3  Help from another person toileting, which includes using toliet, bedpan, or urinal? 3  Help from another person bathing (including washing, rinsing, drying)? 3  Help from another person to put on and taking off regular upper body clothing? 3  Help from another person to put on and taking off regular lower body clothing? 3  6 Click Score 19  ADL G Code Conversion CK  OT Goal Progression  Progress towards OT goals Progressing toward goals  OT Time Calculation  OT Start Time (ACUTE ONLY) 1615  OT Stop Time (ACUTE ONLY) 1633  OT Time Calculation (min) 18 min  OT General Charges  $OT Visit 1 Visit  OT Treatments   $Self Care/Home Management  8-22 mins  [employer], OTR/L [phone]

## 2018-03-14 NOTE — Progress Notes (Signed)
Physical Therapy Treatment Patient Details Name: Nicholas Mason MRN: [id number] DOB: 1946-09-06 Today's Date: 03/14/2018    History of Present Illness Patient is a 72 yo male s/p Posterior Lumbar Interbody Fusion  - Lumbar four-Lumbar five     PT Comments    Patient seen for activity progression, tolerated ambulation (does endorse increased pain in right knee today). Performed stair negotiation and discussed car transfers in preparation for possible d/c home. Reinforced education with patient.   Follow Up Recommendations  Outpatient PT;Supervision/Assistance - 24 hour(when cleared from restrictions)     Equipment Recommendations  None recommended by PT    Recommendations for Other Services       Precautions / Restrictions Precautions Precautions: Back Precaution Booklet Issued: Yes (comment) Precaution Comments: verbally reviewed spinal precautions Required Braces or Orthoses: Spinal Brace    Mobility  Bed Mobility Overal bed mobility: Needs Assistance Bed Mobility: Rolling;Sidelying to Sit;Sit to Sidelying Rolling: Supervision Sidelying to sit: Supervision     Sit to sidelying: Min assist General bed mobility comments: VCs for positioning and technique. Min assist to elevate LEs back to bed upon fatigue  Transfers Overall transfer level: Needs assistance Equipment used: Rolling walker (2 wheeled) Transfers: Sit to/from Stand Sit to Stand: Supervision         General transfer comment: VCs for hand placement and positioning, increased time and effort noted  Ambulation/Gait Ambulation/Gait assistance: Supervision;Min guard Ambulation Distance (Feet): 200 Feet Assistive device: Rolling walker (2 wheeled) Gait Pattern/deviations: Step-through pattern;Decreased stride length;Drifts right/left;Antalgic Gait velocity: decreased Gait velocity interpretation: <1.8 ft/sec, indicate of risk for recurrent falls General Gait Details: patient with some noted instability  with ambulation due to baseline RLE weakness. Improved stability but heavy reliance on UE support   Stairs Stairs: Yes Stairs assistance: Min assist Stair Management: Step to pattern Number of Stairs: 6 General stair comments: Increased time and effort to perform due to pain in right knee   Wheelchair Mobility    Modified Rankin (Stroke Patients Only)       Balance Overall balance assessment: Mild deficits observed, not formally tested                                          Cognition Arousal/Alertness: Awake/alert Behavior During Therapy: WFL for tasks assessed/performed Overall Cognitive Status: Within Functional Limits for tasks assessed                                        Exercises      General Comments        Pertinent Vitals/Pain Pain Assessment: 0-10 Pain Score: 6  Pain Location: right knee and low back' Pain Descriptors / Indicators: Grimacing;Sore Pain Intervention(s): Monitored during session    Home Living Family/patient expects to be discharged to:: Private residence Living Arrangements: Spouse/significant other Available Help at Discharge: Family Type of Home: Mobile home Home Access: Stairs to enter Entrance Stairs-Rails: None Home Layout: One level Home Equipment: Environmental consultant - 2 wheels;Cane - single point      Prior Function Level of Independence: Independent with assistive device(s)  Gait / Transfers Assistance Needed: was using a RW for mobility       PT Goals (current goals can now be found in the care plan section) Acute Rehab PT Goals Patient Stated Goal:  to go home PT Goal Formulation: With patient/family Time For Goal Achievement: 03/27/18 Potential to Achieve Goals: Good Progress towards PT goals: Progressing toward goals    Frequency    Min 5X/week      PT Plan Current plan remains appropriate    Co-evaluation              AM-PAC PT "6 Clicks" Daily Activity  Outcome  Measure  Difficulty turning over in bed (including adjusting bedclothes, sheets and blankets)?: A Little Difficulty moving from lying on back to sitting on the side of the bed? : A Little Difficulty sitting down on and standing up from a chair with arms (e.g., wheelchair, bedside commode, etc,.)?: A Little Help needed moving to and from a bed to chair (including a wheelchair)?: A Little Help needed walking in hospital room?: A Little Help needed climbing 3-5 steps with a railing? : A Little 6 Click Score: 18    End of Session Equipment Utilized During Treatment: Gait belt;Back brace Activity Tolerance: Patient tolerated treatment well Patient left: in bed;with family/visitor present;with SCD's reapplied Nurse Communication: Mobility status PT Visit Diagnosis: Difficulty in walking, not elsewhere classified (R26.2)     Time: 1610-9604 PT Time Calculation (min) (ACUTE ONLY): 20 min  Charges:  $Gait Training: 8-22 mins                    G Codes:       Charlotte Crumb, PT DPT  Board Certified Neurologic Specialist [phone]    Fabio Asa 03/14/2018, 10:22 AM
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
## 2018-03-15 MED FILL — Thrombin For Soln 20000 Unit: CUTANEOUS | Qty: 1 | Status: AC

## 2018-03-15 NOTE — Progress Notes (Signed)
Patient hemovac removed per MD order, IV removed. D/c instructions given to patient/significant other, questions answered. No prescriptions to be given nor equipment to be delivered prior to d/c.  Patient taken to car via wheelchair by volunteer services.

## 2018-03-15 NOTE — Discharge Summary (Signed)
Physician Discharge Summary  Patient ID: Nicholas Mason MRN: [id number] DOB/AGE: [birth year] 71 y.o. Estimated body mass index is 37.57 kg/m as calculated from the following:   Height as of this encounter:  (1.702 m).   Weight as of this encounter: 108.8 kg (239 lb 13.8 oz).   Admit date: 03/12/2018 Discharge date: 03/15/2018  Admission Diagnoses:grade 1 spondylolisthesis L4-5  Discharge Diagnoses: same Active Problems:   Spondylolisthesis at L4-L5 level   Discharged Condition: good  Hospital Course: patient admitted hospital underwent decompression and stabilization procedure at L4-5 postoperative patient did very well, Lauren the floor was angling and voiding spontaneously tolerating regular diet stable for discharge home.  Consults: Significant Diagnostic Studies: Treatments:decompression stabilization procedure L4-5 Discharge Exam: Blood pressure 108/67, pulse 71, temperature 99.1 F (37.3 C), resp. rate 16, height  (1.702 m), weight 108.8 kg (239 lb 13.8 oz), SpO2 93 %. Strength out of 5 wound clean dry and intact  Disposition: home  Discharge Instructions    Face-to-face encounter (required for Medicare/Medicaid patients)   Complete by:  As directed    I Nini Cavan P certify that this patient is under my care and that I, or a nurse practitioner or physician's assistant working with me, had a face-to-face encounter that meets the physician face-to-face encounter requirements with this patient on 03/15/2018. The encounter with the patient was in whole, or in part for the following medical condition(s) which is the primary reason for home health care (List medical condition): grade 1 spondylolisthesis and lumbar spinal stenosis   The encounter with the patient was in whole, or in part, for the following medical condition, which is the primary reason for home health care:  Grade 1 spondylolisthesis and lumbar spinal stenosis   I certify that, based on my findings, the  following services are medically necessary home health services:  Physical therapy   Reason for Medically Necessary Home Health Services:  Therapy- Instruction on Safe use of Assistive Devices for ADLs   My clinical findings support the need for the above services:  Pain interferes with ambulation/mobility   Further, I certify that my clinical findings support that this patient is homebound due to:  Pain interferes with ambulation/mobility   Home Health   Complete by:  As directed    To provide the following care/treatments:  OT     Allergies as of 03/15/2018   No Known Allergies     Medication List    TAKE these medications   amLODipine 10 MG tablet Commonly known as:  NORVASC Take 10 mg by mouth daily.   baclofen 10 MG tablet Commonly known as:  LIORESAL Take 10 mg by mouth 4 (four) times daily.   diclofenac sodium 1 % Gel Commonly known as:  VOLTAREN Apply 1 g topically 4 (four) times daily as needed for pain.   ibuprofen 200 MG tablet Commonly known as:  ADVIL,MOTRIN Take 200-400 mg by mouth every 8 (eight) hours as needed (FOR PAIN.).   levothyroxine 125 MCG tablet Commonly known as:  SYNTHROID, LEVOTHROID Take 125 mcg by mouth daily before breakfast.   lisinopril 40 MG tablet Commonly known as:  PRINIVIL,ZESTRIL Take 40 mg by mouth daily.   omeprazole 20 MG capsule Commonly known as:  PRILOSEC Take 20 mg by mouth daily.   oxyCODONE-acetaminophen 7.5-325 MG tablet Commonly known as:  PERCOCET Take 1 tablet by mouth every 6 (six) hours as needed for pain.   pravastatin 40 MG tablet Commonly known as:  PRAVACHOL Take  40 mg by mouth at bedtime.        Signed: Leonardo Makris P 03/15/2018, 1:12 PM

## 2018-03-15 NOTE — Care Management Note (Signed)
Case Management Note  Patient Details  Name: Nicholas Mason MRN: [id number] Date of Birth: April 13, 1946  Subjective/Objective:                    Action/Plan:  Patient with workers Comp case manager Sheffield Slider [phone] who is on vacation left message. Got in touch with Ms Gillermina Hu worker Aram Beecham [phone]. Gave referral for [facility] and adaptive equipment over phone and faxed to [phone]. Workers Comp will call patient directly with determination on approval and company to provide services.  Patient and spouse aware  Expected Discharge Date:  03/15/18               Expected Discharge Plan:  Home w Home Health Services  In-House Referral:     Discharge planning Services  CM Consult  Post Acute Care Choice:  Durable Medical Equipment, Home Health Choice offered to:  Patient, Spouse  DME Arranged:    DME Agency:     HH Arranged:  OT HH Agency:     Status of Service:  Completed, signed off  If discussed at Long Length of Stay Meetings, dates discussed:    Additional Comments:  Kingsley Plan, RN 03/15/2018, 1:29 PM

## 2018-03-15 NOTE — Progress Notes (Signed)
Occupational Therapy Treatment Patient Details Name: Nicholas Mason MRN: [id number] DOB: [birth year] Today's Date: 03/15/2018    History of present illness Patient is a 72 yo male s/p Posterior Lumbar Interbody Fusion  - Lumbar four-Lumbar five    OT comments  Pt currently with R knee pain limiting and recommending orthopedic consult to evaluate R knee. Pt reports R knee buckling on him and painful at this time. Pt concerned with ability to obtain AE needed for LB dressing/ bathing at this time. Contacted CM Almyra Free in regards to work comp ability to cover necessary AE to prevent pt from breaking back precautions. AE is required to help patient adhere to no bending twisting and lifting required after surgery.   Pt also requesting to see the doctor. RN notified of patients concerns.    Follow Up Recommendations  Home health OT;Supervision/Assistance - 24 hour    Equipment Recommendations  Toilet rise with handles;Other (comment)    Recommendations for Other Services      Precautions / Restrictions Precautions Precautions: Back Precaution Comments: fall and able to verbalize back precautions Required Braces or Orthoses: Spinal Brace Spinal Brace: Lumbar corset;Applied in sitting position       Mobility Bed Mobility Overal bed mobility: Needs Assistance Bed Mobility: Rolling;Supine to Sit Rolling: Supervision Sidelying to sit: Supervision Supine to sit: Supervision        Transfers Overall transfer level: Needs assistance Equipment used: Rolling walker (2 wheeled) Transfers: Sit to/from Stand Sit to Stand: Min guard         General transfer comment: cues for hand placement and to power up into sitting    Balance                                           ADL either performed or assessed with clinical judgement   ADL Overall ADL's : Needs assistance/impaired                     Lower Body Dressing: Min guard;With adaptive equipment;Sit  to/from stand Lower Body Dressing Details (indicate cue type and reason): pt able to doff socks with reacher and don socks with sock aide. Pt and wife concerned with cost and report that they can not afford to purchase AE and its a works comp case. Pt reports "i guess we will be forced to do things like we been doing. its hard to get these things when your pay has been cut 2/3rd of normal and we are 2 hours from home to be up here" Toilet Transfer: Min Fish farm manager Details (indicate cue type and reason): pt requires bil UE and lateral positioning to push up into standing. Pt unable to power up without BIL UE         Functional mobility during ADLs: Min guard;Rolling walker General ADL Comments: pt reports R knee pain and discomfort with injury to knee prior to surgery. Recommending ortho consult to elevate prior to d/c. pt reports knee buckle with stair transfers and no rail at home     Vision       Perception     Praxis      Cognition Arousal/Alertness: Awake/alert Behavior During Therapy: WFL for tasks assessed/performed Overall Cognitive Status: Within Functional Limits for tasks assessed  Exercises     Shoulder Instructions       General Comments hema vac in place and dressing dry at this time    Pertinent Vitals/ Pain       Pain Assessment: Faces Faces Pain Scale: Hurts a little bit Pain Location: Right knee Pain Descriptors / Indicators: Discomfort;Guarding Pain Intervention(s): Monitored during session;Premedicated before session;Repositioned  Home Living                                          Prior Functioning/Environment              Frequency  Min 2X/week        Progress Toward Goals  OT Goals(current goals can now be found in the care plan section)  Progress towards OT goals: Progressing toward goals  Acute Rehab OT Goals Patient Stated Goal: to get  things covered by workers comp for home OT Goal Formulation: With patient/family Time For Goal Achievement: 03/28/18 Potential to Achieve Goals: Good ADL Goals Pt Will Perform Grooming: with supervision;standing Pt Will Perform Lower Body Bathing: with supervision;sit to/from stand;with adaptive equipment(met with AE use ) Pt Will Perform Lower Body Dressing: with supervision;sit to/from stand;with adaptive equipment Pt Will Transfer to Toilet: with supervision;ambulating;regular height toilet;grab bars Pt Will Perform Toileting - Clothing Manipulation and hygiene: with supervision;with adaptive equipment;sit to/from stand Pt Will Perform Tub/Shower Transfer: Shower transfer;with supervision;rolling walker;ambulating(plans to sponge bath only)  Plan Discharge plan remains appropriate    Co-evaluation                 AM-PAC PT "6 Clicks" Daily Activity     Outcome Measure   Help from another person eating meals?: None Help from another person taking care of personal grooming?: A Little Help from another person toileting, which includes using toliet, bedpan, or urinal?: A Little Help from another person bathing (including washing, rinsing, drying)?: A Little Help from another person to put on and taking off regular upper body clothing?: A Little Help from another person to put on and taking off regular lower body clothing?: A Little 6 Click Score: 19    End of Session Equipment Utilized During Treatment: Back brace;Rolling walker  OT Visit Diagnosis: Pain   Activity Tolerance Patient tolerated treatment well   Patient Left in chair;with call bell/phone within reach;with family/visitor present   Nurse Communication Mobility status;Precautions        Time: 0630-1601 OT Time Calculation (min): 25 min  Charges: OT General Charges $OT Visit: 1 Visit OT Treatments $Self Care/Home Management : 8-22 mins   Jeri Modena   OTR/L Pager: [phone] Office:  [phone] .    Parke Poisson B 03/15/2018, 12:11 PM

## 2019-02-05 IMAGING — RF DG C-ARM 61-120 MIN
1 series · 3 of 3 positions shown · non-contrast
Comparison: .

CLINICAL DATA: Lumbar spine surgery.

EXAM:
LUMBAR SPINE - 2-3 VIEW; DG C-ARM 61-120 MIN

[Series 1: run · 3 of 3 slices shown]
[im 1/3]
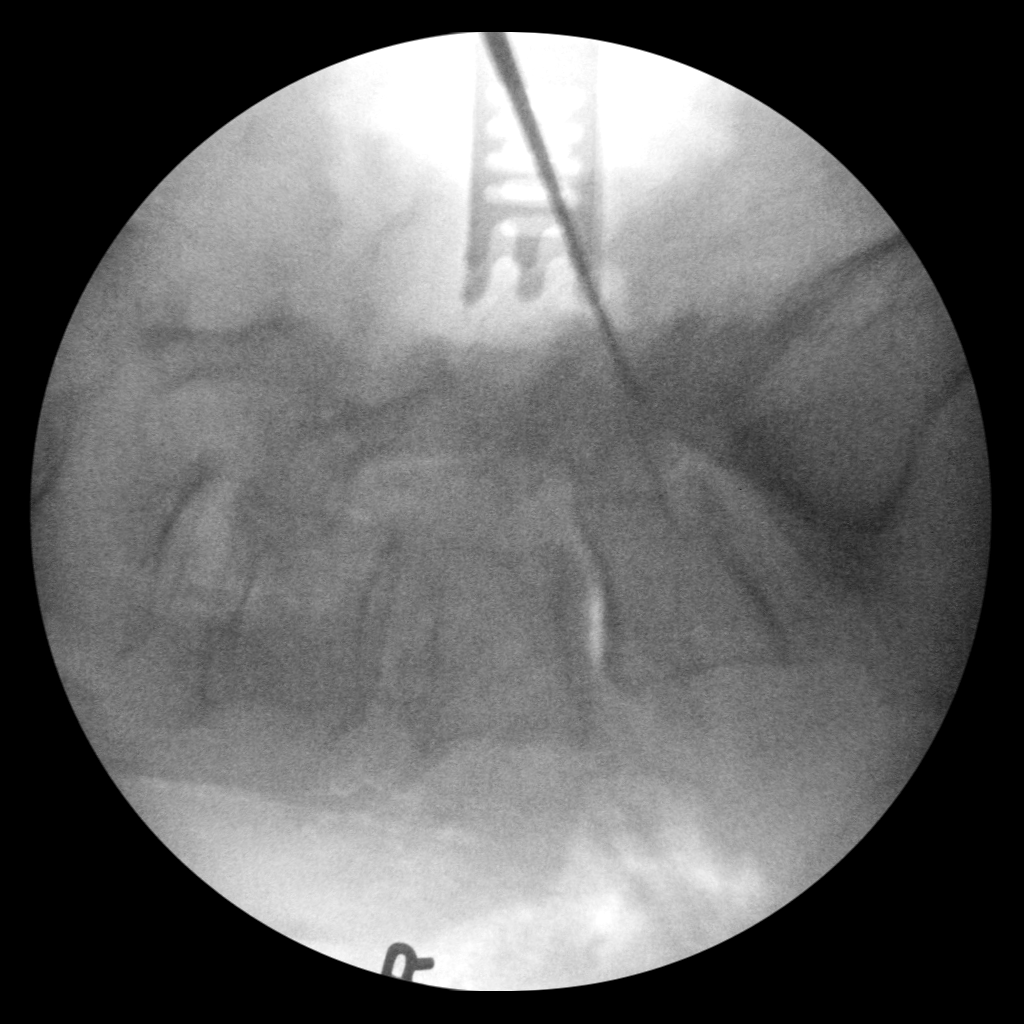
[im 2/3]
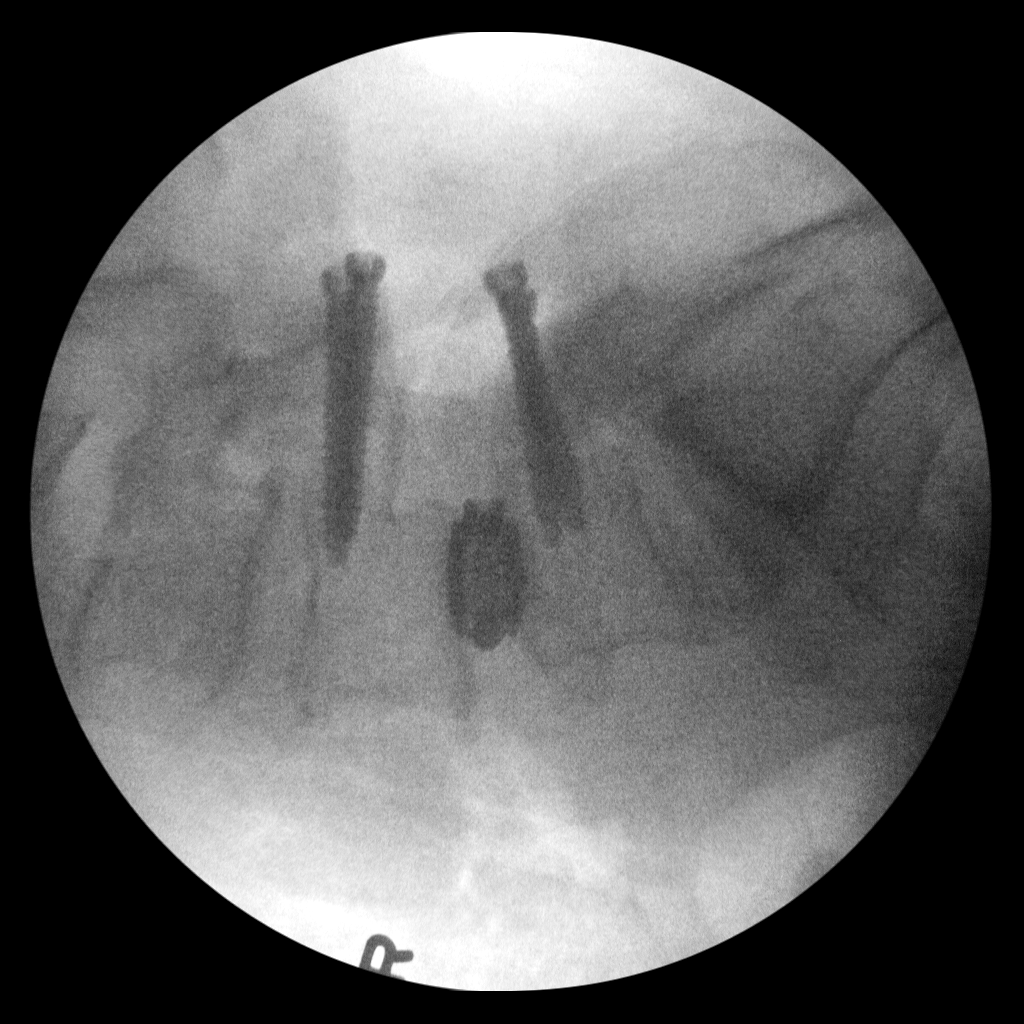
[im 3/3]
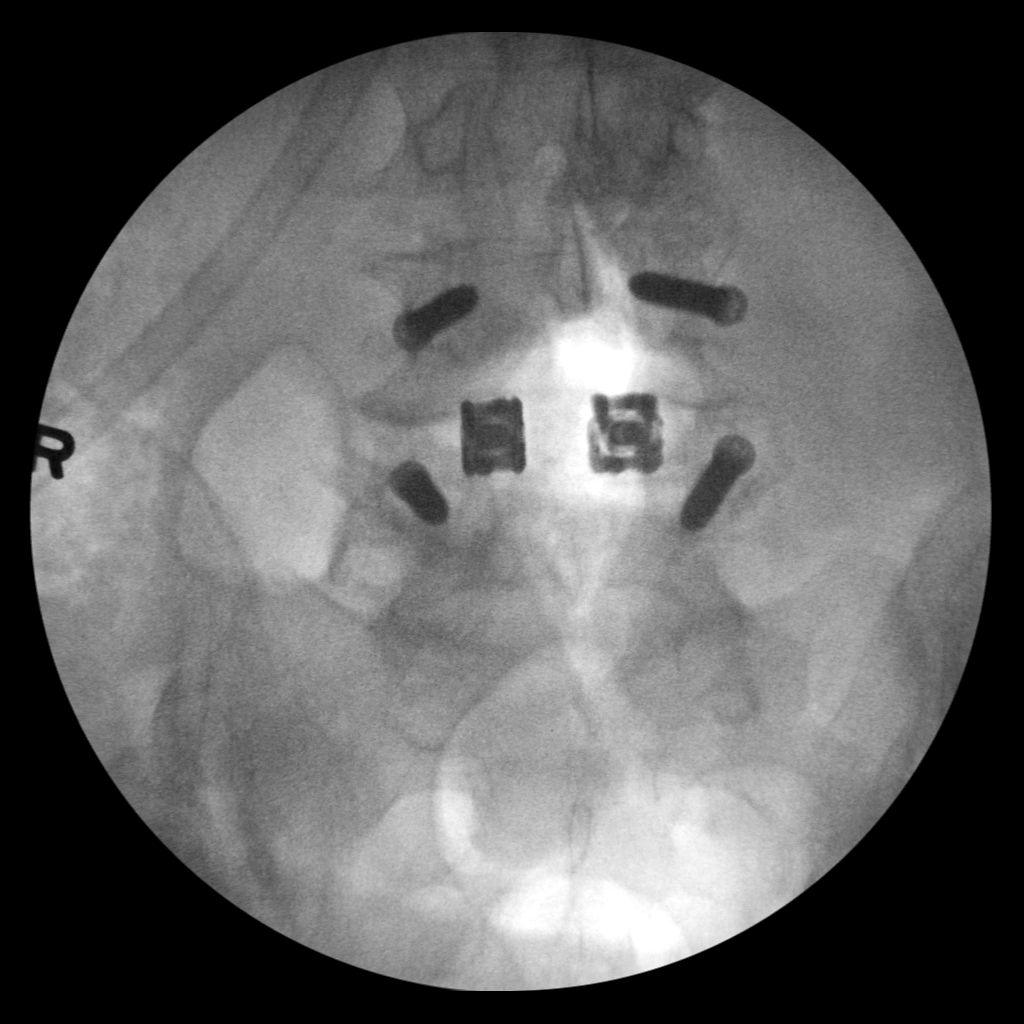

[3 of 3 positions shown; findings below may reference images not displayed]

FINDINGS: Lumbar spine numbered with the lowest segmented appearing lumbar
shaped vertebrae on lateral view as L5. L4 and L5 posterior pedicle
screws and interbody disc fusion device noted. Hardware intact. Mild
anterolisthesis L4 on L5. Three images obtained. Fluoroscopy time 0
minutes 39 seconds.
IMPRESSION: Postsurgical changes lumbar spine as above.

## 2019-03-11 HISTORY — PX: JOINT REPLACEMENT: SHX530

## 2019-07-04 ENCOUNTER — Other Ambulatory Visit: Payer: Self-pay | Admitting: Neurosurgery

## 2019-07-06 NOTE — Progress Notes (Signed)
[facility] DR[facility] STORE [place], [address] [place] [place] [address] Phone: [phone] Fax: [phone]    Your procedure is scheduled on Monday, August 31st.  Report to [facility] Main Entrance "A" at 11:00 A.M., and check in at the Admitting office.  Call this number if you have problems the morning of surgery:  [phone]  Call [phone] if you have any questions prior to your surgery date Monday-Friday 8am-4pm   Remember:  Do not eat or drink after midnight the night before your surgery   Take these medicines the morning of surgery with A SIP OF WATER  amLODipine (NORVASC)  baclofen (LIORESAL) finasteride (PROSCAR) omeprazole (PRILOSEC)  tamsulosin (FLOMAX)   If needed - acetaminophen (TYLENOL), albuterol (VENTOLIN HFA)- inhaler, methocarbamol (ROBAXIN), oxyCODONE-acetaminophen (PERCOCET)  ---bring inhaler with you the day of surgery---  As of today, STOP taking any Aspirin (unless otherwise instructed by your surgeon), diclofenac sodium (VOLTAREN), etodolac (LODINE), Aleve, Naproxen, Ibuprofen, Motrin, Advil, Goody's, BC's, all herbal medications, fish oil, and all vitamins.   The Morning of Surgery  Do not wear jewelry, make-up or nail polish.  Do not wear lotions, powders, or perfumes/colognes, or deodorant  Do not shave 48 hours prior to surgery.  Men may shave face and neck.  Do not bring valuables to the hospital.  [facility] is not responsible for any belongings or valuables.  If you are a smoker, DO NOT Smoke 24 hours prior to surgery IF you wear a CPAP at night please bring your mask, tubing, and machine the morning of surgery   Remember that you must have someone to transport you home after your surgery, and remain with you for 24 hours if you are discharged the same day.  Contacts, glasses, hearing aids, dentures or bridgework may not be worn into surgery.   Leave your suitcase in the  car.  After surgery it may be brought to your room.  For patients admitted to the hospital, discharge time will be determined by your treatment team.  Patients discharged the day of surgery will not be allowed to drive home.    Special instructions:   [place]- Preparing For Surgery  Before surgery, you can play an important role. Because skin is not sterile, your skin needs to be as free of germs as possible. You can reduce the number of germs on your skin by washing with CHG (chlorahexidine gluconate) Soap before surgery.  CHG is an antiseptic cleaner which kills germs and bonds with the skin to continue killing germs even after washing.    Oral Hygiene is also important to reduce your risk of infection.  Remember - BRUSH YOUR TEETH THE MORNING OF SURGERY WITH YOUR REGULAR TOOTHPASTE  Please do not use if you have an allergy to CHG or antibacterial soaps. If your skin becomes reddened/irritated stop using the CHG.  Do not shave (including legs and underarms) for at least 48 hours prior to first CHG shower. It is OK to shave your face.  Please follow these instructions carefully.   1. Shower the NIGHT BEFORE SURGERY and the MORNING OF SURGERY with CHG Soap.   2. If you chose to wash your hair, wash your hair first as usual with your normal shampoo.  3. After you shampoo, rinse your hair and body thoroughly to remove the shampoo.  4. Use CHG as you would any other liquid soap. You can apply CHG directly to  the skin and wash gently with a scrungie or a clean washcloth.   5. Apply the CHG Soap to your body ONLY FROM THE NECK DOWN.  Do not use on open wounds or open sores. Avoid contact with your eyes, ears, mouth and genitals (private parts). Wash Face and genitals (private parts)  with your normal soap.   6. Wash thoroughly, paying special attention to the area where your surgery will be performed.  7. Thoroughly rinse your body with warm water from the neck down.  8. DO NOT  shower/wash with your normal soap after using and rinsing off the CHG Soap.  9. Pat yourself dry with a CLEAN TOWEL.  10. Wear CLEAN PAJAMAS to bed the night before surgery, wear comfortable clothes the morning of surgery  11. Place CLEAN SHEETS on your bed the night of your first shower and DO NOT SLEEP WITH PETS.  Day of Surgery: Do not apply any deodorants/lotions. Please shower the morning of surgery with the CHG soap  Please wear clean clothes to the hospital/surgery center.   Remember to brush your teeth WITH YOUR REGULAR TOOTHPASTE.  Please read over the following fact sheets that you were given.

## 2019-07-07 ENCOUNTER — Encounter (HOSPITAL_COMMUNITY): Payer: Self-pay

## 2019-07-07 ENCOUNTER — Encounter (HOSPITAL_COMMUNITY)
Admission: RE | Admit: 2019-07-07 | Discharge: 2019-07-07 | Disposition: A | Discharge status: Home / Self Care | Payer: Worker's Compensation | Source: Ambulatory Visit | Attending: Neurosurgery | Admitting: Neurosurgery

## 2019-07-07 ENCOUNTER — Other Ambulatory Visit: Payer: Self-pay

## 2019-07-07 ENCOUNTER — Inpatient Hospital Stay (HOSPITAL_COMMUNITY): Admission: RE | Admit: 2019-07-07 | Payer: Self-pay | Source: Ambulatory Visit

## 2019-07-07 DIAGNOSIS — Z01812 Encounter for preprocedural laboratory examination: Secondary | ICD-10-CM | POA: Diagnosis present

## 2019-07-07 LAB — BASIC METABOLIC PANEL
Anion gap: 10 (ref 5–15)
BUN: 14 mg/dL (ref 8–23)
CO2: 23 mmol/L (ref 22–32)
Calcium: 9 mg/dL (ref 8.9–10.3)
Chloride: 104 mmol/L (ref 98–111)
Creatinine, Ser: 0.93 mg/dL (ref 0.61–1.24)
GFR calc Af Amer: >60 mL/min (ref >60)
GFR calc non Af Amer: >60 mL/min (ref >60)
Glucose, Bld: 112 mg/dL — ABNORMAL HIGH (ref 70–99)
Potassium: 3.6 mmol/L (ref 3.5–5.1)
Sodium: 137 mmol/L (ref 135–145)

## 2019-07-07 LAB — TYPE AND SCREEN
ABO/RH(D): O POS
Antibody Screen: NEGATIVE

## 2019-07-07 LAB — CBC
HCT: 40.1 % (ref 39.0–52.0)
Hemoglobin: 13.4 g/dL (ref 13.0–17.0)
MCH: 28.5 pg (ref 26.0–34.0)
MCHC: 33.4 g/dL (ref 30.0–36.0)
MCV: 85.3 fL (ref 80.0–100.0)
Platelets: 285 10*3/uL (ref 150–400)
RBC: 4.7 MIL/uL (ref 4.22–5.81)
RDW: 13.9 % (ref 11.5–15.5)
WBC: 7.2 10*3/uL (ref 4.0–10.5)
nRBC: 0 % (ref 0.0–0.2)

## 2019-07-07 LAB — SURGICAL PCR SCREEN
MRSA, PCR: NEGATIVE
Staphylococcus aureus: NEGATIVE

## 2019-07-07 NOTE — Progress Notes (Signed)
[facility] DR[facility] STORE [place], [place] [address] Phone: [phone] Fax: [phone]    Your procedure is scheduled on FRIDAY  July 22, 2019   Report to [facility] Main Entrance "A" at  [place] , and check in at the Admitting office.   Call this number if you have problems the morning of surgery:  [phone]  Call [phone] if you have any questions prior to your surgery date Monday-Friday 8am-4pm   Remember:  Do not eat or drink after midnight the night before your surgery   Take these medicines the morning of surgery with A SIP OF WATER  amLODipine (NORVASC)  baclofen (LIORESAL) finasteride (PROSCAR) omeprazole (PRILOSEC)  tamsulosin (FLOMAX)   If needed - acetaminophen (TYLENOL), albuterol (VENTOLIN HFA)- inhaler, methocarbamol (ROBAXIN), oxyCODONE-acetaminophen (PERCOCET)  ---bring inhaler with you the day of surgery---  As of today, STOP taking any Aspirin (unless otherwise instructed by your surgeon), diclofenac sodium (VOLTAREN), etodolac (LODINE), Aleve, Naproxen, Ibuprofen, Motrin, Advil, Goody's, BC's, all herbal medications, fish oil, and all vitamins.   The Morning of Surgery  Do not wear jewelry, make-up or nail polish.  Do not wear lotions, powders, or perfumes/colognes, or deodorant  Do not shave 48 hours prior to surgery.  Men may shave face and neck.  Do not bring valuables to the hospital.  [facility] is not responsible for any belongings or valuables.  If you are a smoker, DO NOT Smoke 24 hours prior to surgery IF you wear a CPAP at night please bring your mask, tubing, and machine the morning of surgery   Remember that you must have someone to transport you home after your surgery, and remain with you for 24 hours if you are discharged the same day.  Contacts, glasses, hearing aids, dentures or bridgework may not be worn into surgery.   Leave your suitcase in  the car.  After surgery it may be brought to your room.  For patients admitted to the hospital, discharge time will be determined by your treatment team.  Patients discharged the day of surgery will not be allowed to drive home.    Special instructions:   [place]- Preparing For Surgery  Before surgery, you can play an important role. Because skin is not sterile, your skin needs to be as free of germs as possible. You can reduce the number of germs on your skin by washing with CHG (chlorahexidine gluconate) Soap before surgery.  CHG is an antiseptic cleaner which kills germs and bonds with the skin to continue killing germs even after washing.    Oral Hygiene is also important to reduce your risk of infection.  Remember - BRUSH YOUR TEETH THE MORNING OF SURGERY WITH YOUR REGULAR TOOTHPASTE  Please do not use if you have an allergy to CHG or antibacterial soaps. If your skin becomes reddened/irritated stop using the CHG.  Do not shave (including legs and underarms) for at least 48 hours prior to first CHG shower. It is OK to shave your face.  Please follow these instructions carefully.   1. Shower the NIGHT BEFORE SURGERY and the MORNING OF SURGERY with CHG Soap.   2. If you chose to wash your hair, wash your hair first as usual with your normal shampoo.  3. After you shampoo, rinse your hair and body thoroughly to remove the shampoo.  4. Use CHG as you would any other liquid soap. You  can apply CHG directly to the skin and wash gently with a scrungie or a clean washcloth.   5. Apply the CHG Soap to your body ONLY FROM THE NECK DOWN.  Do not use on open wounds or open sores. Avoid contact with your eyes, ears, mouth and genitals (private parts). Wash Face and genitals (private parts)  with your normal soap.   6. Wash thoroughly, paying special attention to the area where your surgery will be performed.  7. Thoroughly rinse your body with warm water from the neck down.  8. DO NOT  shower/wash with your normal soap after using and rinsing off the CHG Soap.  9. Pat yourself dry with a CLEAN TOWEL.  10. Wear CLEAN PAJAMAS to bed the night before surgery, wear comfortable clothes the morning of surgery  11. Place CLEAN SHEETS on your bed the night of your first shower and DO NOT SLEEP WITH PETS.  Day of Surgery: Do not apply any deodorants/lotions. Please shower the morning of surgery with the CHG soap  Please wear clean clothes to the hospital/surgery center.   Remember to brush your teeth WITH YOUR REGULAR TOOTHPASTE.  Please read over the following fact sheets that you were given.

## 2019-07-07 NOTE — Progress Notes (Signed)
Call from [facility] while pt. Was with me for PAT appt., surgery has been reschedule for 07/22/2019- pt. informed

## 2019-07-07 NOTE — Progress Notes (Signed)
PCP - Precious Haws, [facility]  Cardiologist - n/a  Chest x-ray -  EKG - requesting from [facility]  Stress Test -n/a  ECHO - n/a Cardiac Cath -n/a   Sleep Study - n/a CPAP -   Fasting Blood Sugar - n/a Checks Blood Sugar _____ times a day  Blood Thinner Instructions:n/a Aspirin Instructions:n/a  Anesthesia review: pending labs  Patient denies shortness of breath, fever, cough and chest pain at PAT appointment   Patient verbalized understanding of instructions that were given to them at the PAT appointment. Patient was also instructed that they will need to review over the PAT instructions again at home before surgery.

## 2019-07-19 ENCOUNTER — Other Ambulatory Visit (HOSPITAL_COMMUNITY)
Admission: RE | Admit: 2019-07-19 | Discharge: 2019-07-19 | Disposition: A | Discharge status: Home / Self Care | Payer: Worker's Compensation | Source: Ambulatory Visit | Attending: Neurosurgery | Admitting: Neurosurgery

## 2019-07-19 DIAGNOSIS — Z01812 Encounter for preprocedural laboratory examination: Secondary | ICD-10-CM | POA: Insufficient documentation

## 2019-07-19 DIAGNOSIS — Z20828 Contact with and (suspected) exposure to other viral communicable diseases: Secondary | ICD-10-CM | POA: Insufficient documentation

## 2019-07-19 DIAGNOSIS — M545 Low back pain: Secondary | ICD-10-CM | POA: Insufficient documentation

## 2019-07-20 LAB — NOVEL CORONAVIRUS, NAA (HOSP ORDER, SEND-OUT TO REF LAB; TAT 18-24 HRS): SARS-CoV-2, NAA: NOT DETECTED

## 2019-07-21 ENCOUNTER — Other Ambulatory Visit: Payer: Self-pay

## 2019-07-21 ENCOUNTER — Encounter (HOSPITAL_COMMUNITY): Payer: Self-pay | Admitting: *Deleted

## 2019-07-21 NOTE — Progress Notes (Signed)
Nicholas Mason denies chest pain or shortness of breath.  Patient tested negative for Covid 07/20/2019, he reports that he has been in quarantine since.  I requested the EKG tracing from [facility].

## 2019-07-22 ENCOUNTER — Inpatient Hospital Stay (HOSPITAL_COMMUNITY): Payer: Worker's Compensation | Admitting: Registered Nurse

## 2019-07-22 ENCOUNTER — Encounter (HOSPITAL_COMMUNITY): Payer: Self-pay

## 2019-07-22 ENCOUNTER — Inpatient Hospital Stay (HOSPITAL_COMMUNITY)
Admission: RE | Admit: 2019-07-22 | Discharge: 2019-07-23 | DRG: 455 | Disposition: A | Discharge status: Home / Self Care | Payer: Worker's Compensation | Attending: Neurosurgery | Admitting: Neurosurgery

## 2019-07-22 ENCOUNTER — Inpatient Hospital Stay (HOSPITAL_COMMUNITY): Payer: Worker's Compensation

## 2019-07-22 ENCOUNTER — Encounter (HOSPITAL_COMMUNITY)
Admission: RE | Disposition: A | Discharge status: Home / Self Care | Payer: Self-pay | Source: Home / Self Care | Attending: Neurosurgery

## 2019-07-22 DIAGNOSIS — Z79899 Other long term (current) drug therapy: Secondary | ICD-10-CM | POA: Diagnosis not present

## 2019-07-22 DIAGNOSIS — I10 Essential (primary) hypertension: Secondary | ICD-10-CM | POA: Diagnosis present

## 2019-07-22 DIAGNOSIS — Z79891 Long term (current) use of opiate analgesic: Secondary | ICD-10-CM

## 2019-07-22 DIAGNOSIS — M48061 Spinal stenosis, lumbar region without neurogenic claudication: Secondary | ICD-10-CM | POA: Diagnosis not present

## 2019-07-22 DIAGNOSIS — M1990 Unspecified osteoarthritis, unspecified site: Secondary | ICD-10-CM | POA: Diagnosis present

## 2019-07-22 DIAGNOSIS — Z419 Encounter for procedure for purposes other than remedying health state, unspecified: Secondary | ICD-10-CM

## 2019-07-22 DIAGNOSIS — Z96641 Presence of right artificial hip joint: Secondary | ICD-10-CM | POA: Diagnosis present

## 2019-07-22 DIAGNOSIS — Z20828 Contact with and (suspected) exposure to other viral communicable diseases: Secondary | ICD-10-CM | POA: Diagnosis present

## 2019-07-22 DIAGNOSIS — K219 Gastro-esophageal reflux disease without esophagitis: Secondary | ICD-10-CM | POA: Diagnosis present

## 2019-07-22 DIAGNOSIS — M532X6 Spinal instabilities, lumbar region: Secondary | ICD-10-CM | POA: Diagnosis present

## 2019-07-22 DIAGNOSIS — N400 Enlarged prostate without lower urinary tract symptoms: Secondary | ICD-10-CM | POA: Diagnosis not present

## 2019-07-22 DIAGNOSIS — M5116 Intervertebral disc disorders with radiculopathy, lumbar region: Secondary | ICD-10-CM | POA: Diagnosis present

## 2019-07-22 DIAGNOSIS — M5136 Other intervertebral disc degeneration, lumbar region: Secondary | ICD-10-CM | POA: Diagnosis not present

## 2019-07-22 DIAGNOSIS — Z981 Arthrodesis status: Secondary | ICD-10-CM | POA: Diagnosis not present

## 2019-07-22 DIAGNOSIS — E039 Hypothyroidism, unspecified: Secondary | ICD-10-CM | POA: Diagnosis present

## 2019-07-22 DIAGNOSIS — E7800 Pure hypercholesterolemia, unspecified: Secondary | ICD-10-CM | POA: Diagnosis not present

## 2019-07-22 DIAGNOSIS — Z79890 Hormone replacement therapy (postmenopausal): Secondary | ICD-10-CM

## 2019-07-22 DIAGNOSIS — M4800 Spinal stenosis, site unspecified: Secondary | ICD-10-CM | POA: Diagnosis present

## 2019-07-22 HISTORY — DX: Dyspnea, unspecified: R06.00

## 2019-07-22 HISTORY — DX: Benign prostatic hyperplasia without lower urinary tract symptoms: N40.0

## 2019-07-22 LAB — TYPE AND SCREEN
ABO/RH(D): O POS
Antibody Screen: NEGATIVE

## 2019-07-22 SURGERY — POSTERIOR LUMBAR FUSION 1 WITH HARDWARE REMOVAL
Anesthesia: General | Site: Back

## 2019-07-22 MED ORDER — CHLORHEXIDINE GLUCONATE CLOTH 2 % EX PADS: 6.0000 | MEDICATED_PAD | Freq: Once | CUTANEOUS | Status: DC

## 2019-07-22 MED ORDER — DEXAMETHASONE SODIUM PHOSPHATE 10 MG/ML IJ SOLN
10.0000 mg | Freq: Once | INTRAMUSCULAR | Status: DC
  Filled 2019-07-22: qty 1

## 2019-07-22 MED ORDER — SUGAMMADEX SODIUM 200 MG/2ML IV SOLN
INTRAVENOUS | Status: DC | PRN
  Administered 2019-07-22: 200 mg via INTRAVENOUS

## 2019-07-22 MED ORDER — FENTANYL CITRATE (PF) 250 MCG/5ML IJ SOLN
INTRAMUSCULAR | Status: AC
  Filled 2019-07-22: qty 5

## 2019-07-22 MED ORDER — SODIUM CHLORIDE 0.9% FLUSH
3.0000 mL | Freq: Two times a day (BID) | INTRAVENOUS | Status: DC
  Administered 2019-07-22: 16:00:00 3 mL via INTRAVENOUS

## 2019-07-22 MED ORDER — SODIUM CHLORIDE 0.9 % IV SOLN
INTRAVENOUS | Status: DC | PRN
  Administered 2019-07-22: 500 mL

## 2019-07-22 MED ORDER — LIDOCAINE 2% (20 MG/ML) 5 ML SYRINGE
INTRAMUSCULAR | Status: DC | PRN
  Administered 2019-07-22: 80 mg via INTRAVENOUS

## 2019-07-22 MED ORDER — CEFAZOLIN SODIUM-DEXTROSE 2-4 GM/100ML-% IV SOLN
2.0000 g | INTRAVENOUS | Status: AC
  Administered 2019-07-22 (×2): 2 g via INTRAVENOUS
  Filled 2019-07-22: qty 100

## 2019-07-22 MED ORDER — SODIUM CHLORIDE 0.9 % IV SOLN
INTRAVENOUS | Status: DC | PRN
  Administered 2019-07-22: 25 ug/min via INTRAVENOUS

## 2019-07-22 MED ORDER — ROCURONIUM BROMIDE 10 MG/ML (PF) SYRINGE
PREFILLED_SYRINGE | INTRAVENOUS | Status: AC
  Filled 2019-07-22: qty 10

## 2019-07-22 MED ORDER — PROPOFOL 10 MG/ML IV BOLUS
INTRAVENOUS | Status: AC
  Filled 2019-07-22: qty 20

## 2019-07-22 MED ORDER — OXYCODONE-ACETAMINOPHEN 5-325 MG PO TABS
1.0000 | ORAL_TABLET | ORAL | Status: DC | PRN
  Administered 2019-07-22 – 2019-07-23 (×2): 2 via ORAL
  Filled 2019-07-22 (×2): qty 2

## 2019-07-22 MED ORDER — LACTATED RINGERS IV SOLN
INTRAVENOUS | Status: DC | PRN
  Administered 2019-07-22 (×2): via INTRAVENOUS

## 2019-07-22 MED ORDER — LIDOCAINE 2% (20 MG/ML) 5 ML SYRINGE
INTRAMUSCULAR | Status: AC
  Filled 2019-07-22: qty 5

## 2019-07-22 MED ORDER — OXYCODONE HCL 5 MG PO TABS
5.0000 mg | ORAL_TABLET | Freq: Once | ORAL | Status: AC | PRN
  Administered 2019-07-22: 5 mg via ORAL

## 2019-07-22 MED ORDER — THROMBIN 20000 UNITS EX KIT
PACK | CUTANEOUS | Status: AC
  Filled 2019-07-22: qty 1

## 2019-07-22 MED ORDER — MIDAZOLAM HCL 2 MG/2ML IJ SOLN
INTRAMUSCULAR | Status: AC
  Filled 2019-07-22: qty 2

## 2019-07-22 MED ORDER — ACETAMINOPHEN 10 MG/ML IV SOLN
INTRAVENOUS | Status: DC | PRN
  Administered 2019-07-22: 1000 mg via INTRAVENOUS

## 2019-07-22 MED ORDER — CEFAZOLIN SODIUM-DEXTROSE 2-4 GM/100ML-% IV SOLN
2.0000 g | Freq: Three times a day (TID) | INTRAVENOUS | Status: AC
  Administered 2019-07-22 – 2019-07-23 (×2): 2 g via INTRAVENOUS
  Filled 2019-07-22 (×2): qty 100

## 2019-07-22 MED ORDER — CYCLOBENZAPRINE HCL 10 MG PO TABS
10.0000 mg | ORAL_TABLET | Freq: Three times a day (TID) | ORAL | Status: DC | PRN
  Administered 2019-07-22: 22:00:00 10 mg via ORAL
  Filled 2019-07-22 (×2): qty 1

## 2019-07-22 MED ORDER — PANTOPRAZOLE SODIUM 40 MG PO TBEC: 40.0000 mg | DELAYED_RELEASE_TABLET | Freq: Every day | ORAL | Status: DC

## 2019-07-22 MED ORDER — OXYCODONE-ACETAMINOPHEN 7.5-325 MG PO TABS
1.0000 | ORAL_TABLET | Freq: Four times a day (QID) | ORAL | Status: DC | PRN
  Administered 2019-07-22: 17:00:00 1 via ORAL
  Filled 2019-07-22: qty 1

## 2019-07-22 MED ORDER — OXYCODONE HCL 5 MG/5ML PO SOLN: 5.0000 mg | Freq: Once | ORAL | Status: AC | PRN

## 2019-07-22 MED ORDER — ALBUMIN HUMAN 5 % IV SOLN
INTRAVENOUS | Status: DC | PRN
  Administered 2019-07-22: 11:00:00 via INTRAVENOUS

## 2019-07-22 MED ORDER — EPHEDRINE SULFATE 50 MG/ML IJ SOLN
INTRAMUSCULAR | Status: DC | PRN
  Administered 2019-07-22 (×4): 5 mg via INTRAVENOUS

## 2019-07-22 MED ORDER — LISINOPRIL 40 MG PO TABS
40.0000 mg | ORAL_TABLET | Freq: Every day | ORAL | Status: DC
  Administered 2019-07-22: 16:00:00 40 mg via ORAL
  Filled 2019-07-22 (×2): qty 1
  Filled 2019-07-22: qty 2

## 2019-07-22 MED ORDER — ONDANSETRON HCL 4 MG/2ML IJ SOLN
INTRAMUSCULAR | Status: AC
  Filled 2019-07-22: qty 2

## 2019-07-22 MED ORDER — ALUM & MAG HYDROXIDE-SIMETH 200-200-20 MG/5ML PO SUSP: 30.0000 mL | Freq: Four times a day (QID) | ORAL | Status: DC | PRN

## 2019-07-22 MED ORDER — ONDANSETRON HCL 4 MG/2ML IJ SOLN: 4.0000 mg | Freq: Four times a day (QID) | INTRAMUSCULAR | Status: DC | PRN

## 2019-07-22 MED ORDER — METHOCARBAMOL 500 MG PO TABS: 500.0000 mg | ORAL_TABLET | Freq: Two times a day (BID) | ORAL | Status: DC | PRN

## 2019-07-22 MED ORDER — HYDROMORPHONE HCL 1 MG/ML IJ SOLN: 0.2500 mg | INTRAMUSCULAR | Status: DC | PRN

## 2019-07-22 MED ORDER — MIDAZOLAM HCL 2 MG/2ML IJ SOLN
INTRAMUSCULAR | Status: DC | PRN
  Administered 2019-07-22: 2 mg via INTRAVENOUS

## 2019-07-22 MED ORDER — AMLODIPINE BESYLATE 10 MG PO TABS
10.0000 mg | ORAL_TABLET | Freq: Every day | ORAL | Status: DC
  Filled 2019-07-22: qty 1

## 2019-07-22 MED ORDER — FINASTERIDE 5 MG PO TABS: 5.0000 mg | ORAL_TABLET | Freq: Every day | ORAL | Status: DC

## 2019-07-22 MED ORDER — THROMBIN 20000 UNITS EX SOLR
CUTANEOUS | Status: DC | PRN
  Administered 2019-07-22: 20 mL via TOPICAL

## 2019-07-22 MED ORDER — BUPIVACAINE-EPINEPHRINE (PF) 0.25% -1:200000 IJ SOLN
INTRAMUSCULAR | Status: AC
  Filled 2019-07-22: qty 30

## 2019-07-22 MED ORDER — ARTIFICIAL TEARS OPHTHALMIC OINT
TOPICAL_OINTMENT | OPHTHALMIC | Status: AC
  Filled 2019-07-22: qty 3.5

## 2019-07-22 MED ORDER — BUPIVACAINE HCL (PF) 0.25 % IJ SOLN
INTRAMUSCULAR | Status: AC
  Filled 2019-07-22: qty 10

## 2019-07-22 MED ORDER — BUPIVACAINE LIPOSOME 1.3 % IJ SUSP
20.0000 mL | Freq: Once | INTRAMUSCULAR | Status: DC
  Filled 2019-07-22: qty 20

## 2019-07-22 MED ORDER — THROMBIN 20000 UNITS EX SOLR
CUTANEOUS | Status: AC
  Filled 2019-07-22: qty 20000

## 2019-07-22 MED ORDER — ESMOLOL HCL 100 MG/10ML IV SOLN
INTRAVENOUS | Status: DC | PRN
  Administered 2019-07-22: 10 mg via INTRAVENOUS

## 2019-07-22 MED ORDER — BUPIVACAINE LIPOSOME 1.3 % IJ SUSP
INTRAMUSCULAR | Status: DC | PRN
  Administered 2019-07-22: 20 mL

## 2019-07-22 MED ORDER — LIDOCAINE-EPINEPHRINE 1 %-1:100000 IJ SOLN
INTRAMUSCULAR | Status: DC | PRN
  Administered 2019-07-22: 10 mL

## 2019-07-22 MED ORDER — ACETAMINOPHEN 10 MG/ML IV SOLN
INTRAVENOUS | Status: AC
  Filled 2019-07-22: qty 100

## 2019-07-22 MED ORDER — PROPOFOL 10 MG/ML IV BOLUS
INTRAVENOUS | Status: DC | PRN
  Administered 2019-07-22: 30 mg via INTRAVENOUS
  Administered 2019-07-22: 150 mg via INTRAVENOUS
  Administered 2019-07-22: 20 mg via INTRAVENOUS

## 2019-07-22 MED ORDER — SODIUM CHLORIDE 0.9 % IV SOLN
INTRAVENOUS | Status: DC | PRN
  Administered 2019-07-22: 13:00:00 via INTRAVENOUS

## 2019-07-22 MED ORDER — ARTIFICIAL TEARS OPHTHALMIC OINT
TOPICAL_OINTMENT | OPHTHALMIC | Status: DC | PRN
  Administered 2019-07-22: 1 via OPHTHALMIC

## 2019-07-22 MED ORDER — 0.9 % SODIUM CHLORIDE (POUR BTL) OPTIME
TOPICAL | Status: DC | PRN
  Administered 2019-07-22: 1000 mL

## 2019-07-22 MED ORDER — LEVOTHYROXINE SODIUM 125 MCG PO TABS
125.0000 ug | ORAL_TABLET | Freq: Every day | ORAL | Status: DC
  Administered 2019-07-23: 06:00:00 125 ug via ORAL
  Filled 2019-07-22: qty 1

## 2019-07-22 MED ORDER — ROCURONIUM BROMIDE 10 MG/ML (PF) SYRINGE
PREFILLED_SYRINGE | INTRAVENOUS | Status: DC | PRN
  Administered 2019-07-22: 10 mg via INTRAVENOUS
  Administered 2019-07-22 (×2): 20 mg via INTRAVENOUS
  Administered 2019-07-22: 50 mg via INTRAVENOUS
  Administered 2019-07-22: 20 mg via INTRAVENOUS

## 2019-07-22 MED ORDER — LACTATED RINGERS IV SOLN
INTRAVENOUS | Status: DC
  Administered 2019-07-22: 08:00:00 via INTRAVENOUS

## 2019-07-22 MED ORDER — ACETAMINOPHEN 650 MG RE SUPP: 650.0000 mg | RECTAL | Status: DC | PRN

## 2019-07-22 MED ORDER — DEXAMETHASONE SODIUM PHOSPHATE 10 MG/ML IJ SOLN
INTRAMUSCULAR | Status: DC | PRN
  Administered 2019-07-22: 10 mg via INTRAVENOUS

## 2019-07-22 MED ORDER — ETODOLAC 500 MG PO TABS: 500.0000 mg | ORAL_TABLET | Freq: Two times a day (BID) | ORAL | Status: DC | PRN

## 2019-07-22 MED ORDER — PRAVASTATIN SODIUM 40 MG PO TABS
40.0000 mg | ORAL_TABLET | Freq: Every day | ORAL | Status: DC
  Administered 2019-07-22: 20:00:00 40 mg via ORAL
  Filled 2019-07-22: qty 1

## 2019-07-22 MED ORDER — TAMSULOSIN HCL 0.4 MG PO CAPS
0.4000 mg | ORAL_CAPSULE | Freq: Every day | ORAL | Status: DC
  Administered 2019-07-22: 16:00:00 0.4 mg via ORAL
  Filled 2019-07-22: qty 1

## 2019-07-22 MED ORDER — ONDANSETRON HCL 4 MG PO TABS: 4.0000 mg | ORAL_TABLET | Freq: Four times a day (QID) | ORAL | Status: DC | PRN

## 2019-07-22 MED ORDER — DEXAMETHASONE SODIUM PHOSPHATE 10 MG/ML IJ SOLN
INTRAMUSCULAR | Status: AC
  Filled 2019-07-22: qty 1

## 2019-07-22 MED ORDER — MENTHOL 3 MG MT LOZG: 1.0000 | LOZENGE | OROMUCOSAL | Status: DC | PRN

## 2019-07-22 MED ORDER — HYDROMORPHONE HCL 1 MG/ML IJ SOLN
0.5000 mg | INTRAMUSCULAR | Status: DC | PRN
  Administered 2019-07-22: 16:00:00 0.5 mg via INTRAVENOUS
  Filled 2019-07-22: qty 0.5

## 2019-07-22 MED ORDER — BACLOFEN 10 MG PO TABS
10.0000 mg | ORAL_TABLET | Freq: Three times a day (TID) | ORAL | Status: DC
  Administered 2019-07-22: 17:00:00 10 mg via ORAL
  Filled 2019-07-22: qty 1

## 2019-07-22 MED ORDER — ACETAMINOPHEN 325 MG PO TABS: 325.0000 mg | ORAL_TABLET | Freq: Four times a day (QID) | ORAL | Status: DC | PRN

## 2019-07-22 MED ORDER — SODIUM CHLORIDE 0.9% FLUSH: 3.0000 mL | INTRAVENOUS | Status: DC | PRN

## 2019-07-22 MED ORDER — OXYCODONE HCL 5 MG PO TABS
ORAL_TABLET | ORAL | Status: AC
  Filled 2019-07-22: qty 1

## 2019-07-22 MED ORDER — FENTANYL CITRATE (PF) 250 MCG/5ML IJ SOLN
INTRAMUSCULAR | Status: DC | PRN
  Administered 2019-07-22 (×4): 50 ug via INTRAVENOUS
  Administered 2019-07-22: 100 ug via INTRAVENOUS
  Administered 2019-07-22: 50 ug via INTRAVENOUS
  Administered 2019-07-22: 100 ug via INTRAVENOUS
  Administered 2019-07-22 (×2): 50 ug via INTRAVENOUS

## 2019-07-22 MED ORDER — SODIUM CHLORIDE 0.9 % IV SOLN: 250.0000 mL | INTRAVENOUS | Status: DC

## 2019-07-22 MED ORDER — PHENOL 1.4 % MT LIQD: 1.0000 | OROMUCOSAL | Status: DC | PRN

## 2019-07-22 MED ORDER — PROMETHAZINE HCL 25 MG/ML IJ SOLN: 6.2500 mg | INTRAMUSCULAR | Status: DC | PRN

## 2019-07-22 MED ORDER — PHENYLEPHRINE HCL (PRESSORS) 10 MG/ML IV SOLN
INTRAVENOUS | Status: DC | PRN
  Administered 2019-07-22 (×2): 80 ug via INTRAVENOUS

## 2019-07-22 MED ORDER — PANTOPRAZOLE SODIUM 40 MG IV SOLR: 40.0000 mg | Freq: Every day | INTRAVENOUS | Status: DC

## 2019-07-22 MED ORDER — ALBUTEROL SULFATE (2.5 MG/3ML) 0.083% IN NEBU: 3.0000 mL | INHALATION_SOLUTION | Freq: Four times a day (QID) | RESPIRATORY_TRACT | Status: DC | PRN

## 2019-07-22 MED ORDER — ACETAMINOPHEN 325 MG PO TABS: 650.0000 mg | ORAL_TABLET | ORAL | Status: DC | PRN

## 2019-07-22 MED ORDER — LIDOCAINE-EPINEPHRINE 1 %-1:100000 IJ SOLN
INTRAMUSCULAR | Status: AC
  Filled 2019-07-22: qty 1

## 2019-07-22 MED ORDER — ONDANSETRON HCL 4 MG/2ML IJ SOLN
INTRAMUSCULAR | Status: DC | PRN
  Administered 2019-07-22: 4 mg via INTRAVENOUS

## 2019-07-22 SURGICAL SUPPLY — 78 items
BAG DECANTER FOR FLEXI CONT (MISCELLANEOUS) ×3 IMPLANT
BENZOIN TINCTURE PRP APPL 2/3 (GAUZE/BANDAGES/DRESSINGS) ×3 IMPLANT
BLADE CLIPPER SURG (BLADE) IMPLANT
BLADE SURG 11 STRL SS (BLADE) ×3 IMPLANT
BONE VIVIGEN FORMABLE 5.4CC (Bone Implant) ×3 IMPLANT
BUR CUTTER 7.0 ROUND (BURR) ×3 IMPLANT
BUR MATCHSTICK NEURO 3.0 LAGG (BURR) ×3 IMPLANT
CANISTER SUCT 3000ML PPV (MISCELLANEOUS) ×3 IMPLANT
CAP LOCKING THREADED (Cap) ×18 IMPLANT
CARTRIDGE OIL MAESTRO DRILL (MISCELLANEOUS) ×1 IMPLANT
CLOSURE WOUND 1/2 X4 (GAUZE/BANDAGES/DRESSINGS) ×1
CONT SPEC 4OZ CLIKSEAL STRL BL (MISCELLANEOUS) ×3 IMPLANT
COVER BACK TABLE 60X90IN (DRAPES) ×3 IMPLANT
COVER WAND RF STERILE (DRAPES) IMPLANT
DECANTER SPIKE VIAL GLASS SM (MISCELLANEOUS) ×3 IMPLANT
DERMABOND ADVANCED (GAUZE/BANDAGES/DRESSINGS) ×2
DERMABOND ADVANCED .7 DNX12 (GAUZE/BANDAGES/DRESSINGS) ×1 IMPLANT
DIFFUSER DRILL AIR PNEUMATIC (MISCELLANEOUS) ×3 IMPLANT
DRAPE C-ARM 42X72 X-RAY (DRAPES) ×6 IMPLANT
DRAPE C-ARMOR (DRAPES) IMPLANT
DRAPE HALF SHEET 40X57 (DRAPES) IMPLANT
DRAPE LAPAROTOMY 100X72X124 (DRAPES) ×3 IMPLANT
DRAPE SURG 17X23 STRL (DRAPES) ×3 IMPLANT
DRSG OPSITE 4X5.5 SM (GAUZE/BANDAGES/DRESSINGS) ×3 IMPLANT
DRSG OPSITE POSTOP 4X6 (GAUZE/BANDAGES/DRESSINGS) ×3 IMPLANT
DRSG OPSITE POSTOP 4X8 (GAUZE/BANDAGES/DRESSINGS) ×3 IMPLANT
DURAPREP 26ML APPLICATOR (WOUND CARE) ×3 IMPLANT
ELECT BLADE 4.0 EZ CLEAN MEGAD (MISCELLANEOUS) ×3
ELECT REM PT RETURN 9FT ADLT (ELECTROSURGICAL) ×3
ELECTRODE BLDE 4.0 EZ CLN MEGD (MISCELLANEOUS) ×1 IMPLANT
ELECTRODE REM PT RTRN 9FT ADLT (ELECTROSURGICAL) ×1 IMPLANT
EVACUATOR 1/8 PVC DRAIN (DRAIN) ×3 IMPLANT
EVACUATOR 3/16  PVC DRAIN (DRAIN)
EVACUATOR 3/16 PVC DRAIN (DRAIN) IMPLANT
GAUZE 4X4 16PLY RFD (DISPOSABLE) IMPLANT
GAUZE SPONGE 4X4 12PLY STRL (GAUZE/BANDAGES/DRESSINGS) ×3 IMPLANT
GLOVE BIO SURGEON STRL SZ7 (GLOVE) IMPLANT
GLOVE BIO SURGEON STRL SZ8 (GLOVE) ×6 IMPLANT
GLOVE BIOGEL PI IND STRL 6.5 (GLOVE) ×1 IMPLANT
GLOVE BIOGEL PI IND STRL 7.0 (GLOVE) ×1 IMPLANT
GLOVE BIOGEL PI INDICATOR 6.5 (GLOVE) ×2
GLOVE BIOGEL PI INDICATOR 7.0 (GLOVE) ×2
GLOVE EXAM NITRILE XL STR (GLOVE) IMPLANT
GLOVE INDICATOR 8.5 STRL (GLOVE) ×6 IMPLANT
GLOVE SURG SS PI 6.0 STRL IVOR (GLOVE) ×6 IMPLANT
GOWN STRL REUS W/ TWL LRG LVL3 (GOWN DISPOSABLE) ×2 IMPLANT
GOWN STRL REUS W/ TWL XL LVL3 (GOWN DISPOSABLE) ×2 IMPLANT
GOWN STRL REUS W/TWL 2XL LVL3 (GOWN DISPOSABLE) IMPLANT
GOWN STRL REUS W/TWL LRG LVL3 (GOWN DISPOSABLE) ×4
GOWN STRL REUS W/TWL XL LVL3 (GOWN DISPOSABLE) ×4
HEMOSTAT POWDER KIT SURGIFOAM (HEMOSTASIS) IMPLANT
KIT BASIN OR (CUSTOM PROCEDURE TRAY) ×3 IMPLANT
KIT INFUSE XX SMALL 0.7CC (Orthopedic Implant) ×3 IMPLANT
KIT TURNOVER KIT B (KITS) ×3 IMPLANT
MILL MEDIUM DISP (BLADE) ×3 IMPLANT
NEEDLE HYPO 21X1.5 SAFETY (NEEDLE) ×3 IMPLANT
NEEDLE HYPO 25X1 1.5 SAFETY (NEEDLE) ×3 IMPLANT
NS IRRIG 1000ML POUR BTL (IV SOLUTION) ×6 IMPLANT
OIL CARTRIDGE MAESTRO DRILL (MISCELLANEOUS) ×3
PACK LAMINECTOMY NEURO (CUSTOM PROCEDURE TRAY) ×3 IMPLANT
PAD ARMBOARD 7.5X6 YLW CONV (MISCELLANEOUS) ×9 IMPLANT
PATTIES SURGICAL 1X1 (DISPOSABLE) ×3 IMPLANT
ROD 75MM SPINAL (Rod) ×6 IMPLANT
SCREW AMP MODULAR CREO 6.5X45 (Screw) ×6 IMPLANT
SCREW PA THRD CREO TULIP 5.5X4 (Head) ×6 IMPLANT
SPACER SUSTAIN RT 12 15D 9X26 (Spacer) ×6 IMPLANT
SPONGE LAP 4X18 RFD (DISPOSABLE) IMPLANT
SPONGE SURGIFOAM ABS GEL 100 (HEMOSTASIS) ×3 IMPLANT
STRIP CLOSURE SKIN 1/2X4 (GAUZE/BANDAGES/DRESSINGS) ×2 IMPLANT
SUT VIC AB 0 CT1 18XCR BRD8 (SUTURE) ×2 IMPLANT
SUT VIC AB 0 CT1 8-18 (SUTURE) ×4
SUT VIC AB 2-0 CT1 18 (SUTURE) ×3 IMPLANT
SUT VIC AB 4-0 PS2 27 (SUTURE) ×3 IMPLANT
SYR 20ML LL LF (SYRINGE) ×3 IMPLANT
TOWEL GREEN STERILE (TOWEL DISPOSABLE) ×3 IMPLANT
TOWEL GREEN STERILE FF (TOWEL DISPOSABLE) ×3 IMPLANT
TRAY FOLEY MTR SLVR 16FR STAT (SET/KITS/TRAYS/PACK) ×3 IMPLANT
WATER STERILE IRR 1000ML POUR (IV SOLUTION) ×3 IMPLANT

## 2019-07-22 NOTE — H&P (Signed)
Nicholas Mason is an 73 y.o. male.   Chief Complaint: Back and leg pain HPI: 73 year old gentleman with longstanding back pain from a work-related injury underwent L4-5 posterior lumbar interbody fusion over a year ago patient is had progressive worsening back and pain now rating an L4 nerve root pattern work-up revealed segmental degeneration above his previous fusion at L3-4.  Due to patient failed conservative treatment imaging findings and progression of clinical syndrome I recommended extension of his fusion and decompression at L3-4.  I extensively went over the risks and benefits of the operation with him as well as perioperative course expectations of outcome and alternatives of surgery and he understood and agreed to proceed forward.  Past Medical History:  Diagnosis Date  . Arthritis   . BPH (benign prostatic hyperplasia)   . Dyspnea    due to pain  . GERD (gastroesophageal reflux disease)   . Headache    "regular headache every now and then"  . High cholesterol   . HNP (herniated nucleus pulposus), lumbar   . Hypertension   . Hypothyroidism     Past Surgical History:  Procedure Laterality Date  . [place]   "middle back", 03/2018- lumbar fusion  . BUNIONECTOMY Left   . JOINT REPLACEMENT Right 03/2019    hip  . ROTATOR CUFF REPAIR Left 2011    History reviewed. No pertinent family history. Social History:  reports that he has never smoked. He has never used smokeless tobacco. He reports current alcohol use. He reports that he does not use drugs.  Allergies: No Known Allergies  Medications Prior to Admission  Medication Sig Dispense Refill  . albuterol (VENTOLIN HFA) 108 (90 Base) MCG/ACT inhaler Inhale 1-2 puffs into the lungs every 6 (six) hours as needed for wheezing or shortness of breath.    Marland Kitchen amLODipine (NORVASC) 10 MG tablet Take 10 mg by mouth daily.    . baclofen (LIORESAL) 10 MG tablet Take 10 mg by mouth 3 (three) times daily.   2  . diclofenac  sodium (VOLTAREN) 1 % GEL Apply 1 g topically 4 (four) times daily as needed (leg pain).   2  . etodolac (LODINE) 500 MG tablet Take 500 mg by mouth 2 (two) times daily as needed (pain.).    Marland Kitchen finasteride (PROSCAR) 5 MG tablet Take 5 mg by mouth at bedtime.     Marland Kitchen levothyroxine (SYNTHROID, LEVOTHROID) 125 MCG tablet Take 125 mcg by mouth daily at 2 PM. In the afternoon.    Marland Kitchen lisinopril (PRINIVIL,ZESTRIL) 40 MG tablet Take 40 mg by mouth daily.    . methocarbamol (ROBAXIN) 500 MG tablet Take 500 mg by mouth 2 (two) times daily as needed for muscle spasms.    Marland Kitchen omeprazole (PRILOSEC) 20 MG capsule Take 20 mg by mouth daily.    Marland Kitchen oxyCODONE-acetaminophen (PERCOCET) 7.5-325 MG tablet Take 1 tablet by mouth every 6 (six) hours as needed for pain.  0  . pravastatin (PRAVACHOL) 40 MG tablet Take 40 mg by mouth at bedtime.    . tamsulosin (FLOMAX) 0.4 MG CAPS capsule Take 0.4 mg by mouth daily after breakfast.    . acetaminophen (TYLENOL) 325 MG tablet Take 325-650 mg by mouth every 6 (six) hours as needed (pain.).      Results for orders placed or performed during the hospital encounter of 07/22/19 (from the past 48 hour(s))  Type and screen [place]     Status: None   Collection Time:  07/22/19  8:15 AM  Result Value Ref Range   ABO/RH(D) O POS    Antibody Screen NEG    Sample Expiration      07/25/2019,2359 Performed at [facility], [address] [place], [place] [address]    No results found.  Review of Systems  Musculoskeletal: Positive for back pain.  Neurological: Positive for sensory change and focal weakness.    Blood pressure (!) 123/59, pulse 80, temperature 98.6 F (37 C), temperature source Oral, height 5\' 7"  (1.702 m), weight 104.3 kg, SpO2 94 %. Physical Exam  Constitutional: He is oriented to person, place, and time. He appears well-developed.  HENT:  Head: Normocephalic.  Eyes: Pupils are equal, round, and reactive to light.  Neck: Normal range  of motion.  Respiratory: Effort normal.  GI: Soft.  Musculoskeletal: Normal range of motion.  Neurological: He is alert and oriented to person, place, and time. He has normal strength. GCS eye subscore is 4. GCS verbal subscore is 5. GCS motor subscore is 6.  Strength 5 out of 5 iliopsoas, quads, hamstrings, gastroc, into tibialis, and EHL.  Skin: Skin is warm and dry.     Assessment/Plan 73 year old presents for L3-4 interbody fusion extension of fusion  Brentlee Delage P, MD 07/22/2019, 10:00 AM

## 2019-07-22 NOTE — Anesthesia Preprocedure Evaluation (Signed)
Anesthesia Evaluation  Patient identified by MRN, date of birth, ID band Patient awake    Reviewed: Allergy & Precautions, H&P , NPO status , Patient's Chart, lab work & pertinent test results  Airway Mallampati: III  TM Distance: >3 FB Neck ROM: Full    Dental no notable dental hx. (+) Edentulous Upper, Edentulous Lower, Dental Advisory Given   Pulmonary neg pulmonary ROS,    Pulmonary exam normal breath sounds clear to auscultation       Cardiovascular Exercise Tolerance: Good hypertension, Pt. on medications  Rhythm:Regular Rate:Normal     Neuro/Psych  Headaches, negative psych ROS   GI/Hepatic Neg liver ROS, GERD  Medicated and Controlled,  Endo/Other  Hypothyroidism   Renal/GU negative Renal ROS  negative genitourinary   Musculoskeletal  (+) Arthritis , Osteoarthritis,    Abdominal   Peds  Hematology negative hematology ROS (+)   Anesthesia Other Findings   Reproductive/Obstetrics negative OB ROS                             Anesthesia Physical  Anesthesia Plan  ASA: III  Anesthesia Plan: General   Post-op Pain Management:    Induction: Intravenous  PONV Risk Score and Plan: 3 and Ondansetron, Dexamethasone and Midazolam  Airway Management Planned: Oral ETT  Additional Equipment:   Intra-op Plan:   Post-operative Plan: Extubation in OR  Informed Consent: I have reviewed the patients History and Physical, chart, labs and discussed the procedure including the risks, benefits and alternatives for the proposed anesthesia with the patient or authorized representative who has indicated his/her understanding and acceptance.     Dental advisory given  Plan Discussed with: CRNA  Anesthesia Plan Comments:         Anesthesia Quick Evaluation

## 2019-07-22 NOTE — Transfer of Care (Signed)
Immediate Anesthesia Transfer of Care Note  Patient: Nicholas Mason  Procedure(s) Performed: Posterior Lumbar Interbody Fusion - Lumbar Three-Lumbar Four, removal Lumbar Four- Five (N/A Back)  Patient Location: PACU  Anesthesia Type:General  Level of Consciousness: drowsy and responds to stimulation  Airway & Oxygen Therapy: Patient Spontanous Breathing and Patient connected to face mask oxygen  Post-op Assessment: Report given to RN and Post -op Vital signs reviewed and stable  Post vital signs: Reviewed and stable  Last Vitals:  Vitals Value Taken Time  BP 113/75 07/22/19 1333  Temp    Pulse 73 07/22/19 1335  Resp 18 07/22/19 1335  SpO2 99 % 07/22/19 1335  Vitals shown include unvalidated device data.  Last Pain:  Vitals:   07/22/19 0831  TempSrc: Oral  PainSc:       Patients Stated Pain Goal: 3 (69/45/03 8882)  Complications: No apparent anesthesia complications

## 2019-07-22 NOTE — Anesthesia Procedure Notes (Signed)
Procedure Name: Intubation Date/Time: 07/22/2019 10:15 AM Performed by: Jearld Pies, CRNA Pre-anesthesia Checklist: Patient identified, Emergency Drugs available, Suction available and Patient being monitored Patient Re-evaluated:Patient Re-evaluated prior to induction Oxygen Delivery Method: Circle System Utilized Preoxygenation: Pre-oxygenation with 100% oxygen Induction Type: IV induction Ventilation: Mask ventilation without difficulty and Oral airway inserted - appropriate to patient size Laryngoscope Size: Sabra Heck and 2 Grade View: Grade I Tube type: Oral Tube size: 7.5 mm Number of attempts: 1 Airway Equipment and Method: Stylet and Oral airway Placement Confirmation: ETT inserted through vocal cords under direct vision,  positive ETCO2 and breath sounds checked- equal and bilateral Secured at: 22 cm Tube secured with: Tape Dental Injury: Teeth and Oropharynx as per pre-operative assessment

## 2019-07-22 NOTE — Op Note (Signed)
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  Posterior lumbar interbody fusion utilizing the globus interbody cages packed with locally harvested autograft mixed with revision and BMP  3.  Exploration fusion move of hardware L4-5  4.  Placement bilateral L3 pedicle screws and replacement of rods L3-L5  5.  Posterior lateral arthrodesis L3-4 utilizing locally harvested autograft mixed with revision and BMP  Surgeon: Dominica Severin Jose Corvin  Assistant: Nash Shearer  Anesthesia: General  EBL: Minimal  HPI: Patient is a very pleasant 73 year old gentleman sustained a work-related injury and underwent interbody fusion L4-5 over a year ago patient did very well this healed up very well but then started.  Seen back and bilateral hip pain worse on the right work-up revealed progressive segmental degeneration at L3-4 above his fusion.  Due to patient failed conservative treatment imaging findings and progression of clinical syndrome I recommended decompressive laminectomy and interbody fusion at L3-4 with extension of his construct.  I have extensively gone over the risks and benefits of the operation with him as well as perioperative course expectations of outcome and alternatives of surgery and he understood and agreed to proceed forward.  Operative procedure: Patient brought into the OR was induced under general anesthesia positioned prone the Wilson frame his back was prepped and draped in routine sterile fashion his old incision was opened up and extended cephalad the scar tissue was dissected free and  subperiosteal dissection was carried lamina of L3 and L2 as well as exposed the hardware at L4-5.  I then disconnected the knots remove the rods of fusion L4-5 appear to be solid.  I dissected out the transverse process at L3 and then remove the spinous process performed a complete central decompression at L3 performed complete medial facetectomies dissecting all the scar tissue and freeing up the scar tissue and performing redo foraminotomies of the L4 nerve roots.  Then also extended with radical foraminotomies of the L3 nerve root extensively underbite and the superior to collating facet to gain access to the lateral margin of the space.  This space was then cleaned out radically pituitary rongeurs Epstein curettes Scovil's and paddle shavers.  Utilizing sequential distraction with an 11 distractor in place I placed a 9-12 15 degree insert and rotate titanium cages have been packed with a locally harvested autograft mixed with vivigen.  Then packed an extensive amount of autograft mix and BMP centrally and inserted the contralateral cage.  Then 10-second pedicle screw placement under fluoroscopy which confirmed good position of all the cages pilot holes were drilled and cannulated probed tapped with a 5 5 tap probed again and six 5 x 45 screw inserted L3 bilaterally.  I then aligned these with the previous screws at L4-5 and took a 75 mm rod and connected everything together.  I then compressed the L3 screw against L4 anchored and torqued down all knots reinspected the foramina to confirm patency and no migration of graft material leg Gelfoam of the dura placed a medium Hemovac drain.  Then injected Exparel in the fascia and closed the wound in layers with erupted Vicryl in a running 4 subcuticular Dermabond benzoin Steri-Strips and a sterile dressing was  applied patient recovery room in stable condition.  At the end the case all needle count sponge counts were correct.

## 2019-07-23 NOTE — Discharge Summary (Signed)
Physician Discharge Summary  Patient ID: Nicholas Mason MRN: [id number] DOB/AGE: [birth year] 73 y.o. Estimated body mass index is 36.02 kg/m as calculated from the following:   Height as of this encounter: 5\' 7"  (1.702 m).   Weight as of this encounter: 104.3 kg.   Admit date: 07/22/2019 Discharge date: 07/23/2019  Admission Diagnoses: Lumbar spinal stenosis L3-4 degenerative disc disease and segmental instability Discharge Diagnoses:  Active Problems:   Spinal stenosis   Discharged Condition: good  Hospital Course: Patient is admitted underwent decompressive laminectomy and interbody fusion with extension postoperative patient did very well recovering floor on the floor was ambulating and voiding spontaneously tolerating regular diet and stable discharge home.  Patient has pain medications at home.  Consults: Significant Diagnostic Studies: Treatments: Decompression fusion L3-4 Discharge Exam: Blood pressure 134/71, pulse 73, temperature 97.9 F (36.6 C), temperature source Oral, resp. rate 18, height 5\' 7"  (1.702 m), weight 104.3 kg, SpO2 95 %. Awake and alert strength 5 out of 5  Disposition: Home   Allergies as of 07/23/2019   No Known Allergies     Medication List    TAKE these medications   acetaminophen 325 MG tablet Commonly known as: TYLENOL Take 325-650 mg by mouth every 6 (six) hours as needed (pain.).   albuterol 108 (90 Base) MCG/ACT inhaler Commonly known as: VENTOLIN HFA Inhale 1-2 puffs into the lungs every 6 (six) hours as needed for wheezing or shortness of breath.   amLODipine 10 MG tablet Commonly known as: NORVASC Take 10 mg by mouth daily.   baclofen 10 MG tablet Commonly known as: LIORESAL Take 10 mg by mouth 3 (three) times daily.   diclofenac sodium 1 % Gel Commonly known as: VOLTAREN Apply 1 g topically 4 (four) times daily as needed (leg pain).   etodolac 500 MG tablet Commonly known as: LODINE Take 500 mg by mouth 2 (two) times  daily as needed (pain.).   finasteride 5 MG tablet Commonly known as: PROSCAR Take 5 mg by mouth at bedtime.   levothyroxine 125 MCG tablet Commonly known as: SYNTHROID Take 125 mcg by mouth daily at 2 PM. In the afternoon.   lisinopril 40 MG tablet Commonly known as: ZESTRIL Take 40 mg by mouth daily.   methocarbamol 500 MG tablet Commonly known as: ROBAXIN Take 500 mg by mouth 2 (two) times daily as needed for muscle spasms.   omeprazole 20 MG capsule Commonly known as: PRILOSEC Take 20 mg by mouth daily.   oxyCODONE-acetaminophen 7.5-325 MG tablet Commonly known as: PERCOCET Take 1 tablet by mouth every 6 (six) hours as needed for pain.   pravastatin 40 MG tablet Commonly known as: PRAVACHOL Take 40 mg by mouth at bedtime.   tamsulosin 0.4 MG Caps capsule Commonly known as: FLOMAX Take 0.4 mg by mouth daily after breakfast.        Signed: Catrinia Racicot P 07/23/2019, 6:49 AM

## 2019-07-23 NOTE — Progress Notes (Signed)
Occupational Therapy Evaluation Patient Details Name: Nicholas Mason MRN: [id number] DOB: December 12, 1945 Today's Date: 07/23/2019    History of Present Illness Pt is a 74 yo male s/p decompressive laminectomy and interbody fusion of L3-4. PMH including HNP of lumbar, HTN, hypothyroidism, back surgery, THA, and rotator cuff repair.    Clinical Impression   PTA, pt lived at home with his wife and was able to complete most ADLs independently with exception of requiring assist for donning socks. Pt presented with decreased balance, ROM, and safety awareness. Pt able to recall 2/3 precautions, cues for no twisting. Provided education of back precautions and maintaining precautions while completing ADLs. Pt performed UB dressing, grooming, and toileting with supervision. Pt performed LB dressing with supervision for donning underwear and pants using AE, and min A for donning socks. Pt performed walk in shower transfer with min guard A for safety and balance. Feel pt is close to baseline level, and recommend dc home with no OT follow up. Will sign off.     Follow Up Recommendations  No OT follow up    Equipment Recommendations  None recommended by OT    Recommendations for Other Services PT consult     Precautions / Restrictions Precautions Precautions: Back Precaution Booklet Issued: Yes (comment) Precaution Comments: Reviewed back precautions with pt.  Required Braces or Orthoses: Spinal Brace Spinal Brace: Lumbar corset;Applied in sitting position Restrictions Weight Bearing Restrictions: No      Mobility Bed Mobility Overal bed mobility: Needs Assistance Bed Mobility: Rolling;Sidelying to Sit Rolling: Supervision Sidelying to sit: Supervision       General bed mobility comments: Pt sitting EOB upon arrival. ended session with pt sitting in recliner  Transfers Overall transfer level: Needs assistance Equipment used: Rolling walker (2 wheeled) Transfers: Sit to/from Stand Sit  to Stand: Supervision         General transfer comment: supervision for safety    Balance Overall balance assessment: No apparent balance deficits (not formally assessed)                                         ADL either performed or assessed with clinical judgement   ADL Overall ADL's : Needs assistance/impaired Eating/Feeding: Modified independent   Grooming: Wash/dry hands;Oral care;Supervision/safety;Standing Grooming Details (indicate cue type and reason): Pt completed grooming tasks with supervision for safety Upper Body Bathing: Modified independent   Lower Body Bathing: Minimal assistance;Sit to/from stand Lower Body Bathing Details (indicate cue type and reason): Pt reports that wife will assist with LB ADLs Upper Body Dressing : Modified independent Upper Body Dressing Details (indicate cue type and reason): pt demonstrated donning shirt and lumbar corset with mod I with increased time Lower Body Dressing: Minimal assistance;Sit to/from stand;With adaptive equipment Lower Body Dressing Details (indicate cue type and reason): provided education on using AE, pt demonstrated LB dressing with AE. reports wife will assist with LB ADLs Toilet Transfer: Supervision/safety;Ambulation;BSC(BSC over toilet) Toilet Transfer Details (indicate cue type and reason): required supervision for safety  Toileting- Clothing Manipulation and Hygiene: Supervision/safety;Sit to/from stand Toileting - Clothing Manipulation Details (indicate cue type and reason): pt required supervision for safety Tub/ Shower Transfer: Walk-in shower;Min guard;Ambulation;Grab bars Tub/Shower Transfer Details (indicate cue type and reason): Pt required min guard A for safety Functional mobility during ADLs: Supervision/safety;Rolling walker General ADL Comments: Pt presented with decreased balance, ROM, and safety awareness. However, Feel  pt is close to baseline level for ADLs     Vision  Baseline Vision/History: Wears glasses Wears Glasses: At all times Patient Visual Report: No change from baseline       Perception     Praxis      Pertinent Vitals/Pain Pain Assessment: Faces Pain Score: 6  Faces Pain Scale: Hurts a little bit Pain Location: back Pain Descriptors / Indicators: Aching;Discomfort Pain Intervention(s): Monitored during session     Hand Dominance Right   Extremity/Trunk Assessment Upper Extremity Assessment Upper Extremity Assessment: Overall WFL for tasks assessed   Lower Extremity Assessment Lower Extremity Assessment: Defer to PT evaluation   Cervical / Trunk Assessment Cervical / Trunk Assessment: Other exceptions Cervical / Trunk Exceptions: s/p lumbar surgery   Communication Communication Communication: No difficulties   Cognition Arousal/Alertness: Awake/alert Behavior During Therapy: WFL for tasks assessed/performed;Impulsive Overall Cognitive Status: Within Functional Limits for tasks assessed                                 General Comments: Pt presents slight impulsivity during ADLs. Feel this is close to baseline.   General Comments  Educated about car transfer technique.     Exercises     Shoulder Instructions      Home Living Family/patient expects to be discharged to:: Private residence Living Arrangements: Spouse/significant other Available Help at Discharge: Family;Available 24 hours/day Type of Home: House Home Access: Ramped entrance     Home Layout: One level     Bathroom Shower/Tub: Producer, television/film/videoWalk-in shower   Bathroom Toilet: Standard     Home Equipment: Environmental consultantWalker - 2 wheels;Walker - 4 wheels;Cane - single point;Bedside commode;Adaptive equipment Adaptive Equipment: Reacher;Long-handled shoe horn        Prior Functioning/Environment Level of Independence: Independent        Comments: Independent with most ADLs except requires assistance from wife with socks        OT Problem List:  Decreased strength;Decreased knowledge of use of DME or AE;Decreased knowledge of precautions;Pain      OT Treatment/Interventions:      OT Goals(Current goals can be found in the care plan section) Acute Rehab OT Goals Patient Stated Goal: to go home today OT Goal Formulation: All assessment and education complete, DC therapy  OT Frequency:     Barriers to D/C:            Co-evaluation              AM-PAC OT "6 Clicks" Daily Activity     Outcome Measure Help from another person eating meals?: None Help from another person taking care of personal grooming?: A Little Help from another person toileting, which includes using toliet, bedpan, or urinal?: A Little Help from another person bathing (including washing, rinsing, drying)?: A Lot Help from another person to put on and taking off regular upper body clothing?: A Little Help from another person to put on and taking off regular lower body clothing?: A Lot 6 Click Score: 17   End of Session Equipment Utilized During Treatment: Rolling walker;Back brace Nurse Communication: Mobility status  Activity Tolerance: Patient tolerated treatment well Patient left: in chair;with call bell/phone within reach  OT Visit Diagnosis: Pain;Other (comment)(Education on back precautions and using AE for ADLs) Pain - part of body: (Back )                Time: [id number] OT Time Calculation (min):  29 min Charges:  OT General Charges $OT Visit: 1 Visit OT Evaluation $OT Eval Low Complexity: 1 Low OT Treatments $Self Care/Home Management : 8-22 mins  Sandrea Hammond, OT Student  Sandrea Hammond 07/23/2019, 12:54 PM

## 2019-07-23 NOTE — Plan of Care (Signed)
Patient alert and oriented, mae's well, voiding adequate amount of urine, swallowing without difficulty, no c/o pain at time of discharge. Patient discharged home with family. Script and discharged instructions given to patient. Patient and family stated understanding of instructions given. Patient has an appointment with Dr. Cram 

## 2019-07-23 NOTE — Discharge Instructions (Signed)

## 2019-07-23 NOTE — Evaluation (Signed)
Physical Therapy Evaluation and Discharge Patient Details Name: Nicholas Mason MRN: [id number] DOB: [birth year] Today's Date: 07/23/2019   History of Present Illness  Pt is a 73 y/o male s/p L3-4 PLIF. PMH includes HTN, R THA, and back surgery.   Clinical Impression  Patient evaluated by Physical Therapy with no further acute PT needs identified. All education has been completed and the patient has no further questions. Pt overall steady during gait with RW. Required supervision for safety throughout mobility tasks. Educated about back precautions, generalized walking program, and car transfer technique. Reports wife can assist as needed. See below for any follow-up Physical Therapy or equipment needs. PT is signing off. Thank you for this referral. If needs change, please re-consult.      Follow Up Recommendations No PT follow up    Equipment Recommendations  None recommended by PT    Recommendations for Other Services       Precautions / Restrictions Precautions Precautions: Back Precaution Booklet Issued: Yes (comment) Precaution Comments: Reviewed back precautions with pt.  Required Braces or Orthoses: Spinal Brace Spinal Brace: Lumbar corset Restrictions Weight Bearing Restrictions: No      Mobility  Bed Mobility Overal bed mobility: Needs Assistance Bed Mobility: Rolling;Sidelying to Sit Rolling: Supervision Sidelying to sit: Supervision       General bed mobility comments: Supervision to ensure log roll technique. Cues for sequencing.   Transfers Overall transfer level: Needs assistance Equipment used: Rolling walker (2 wheeled) Transfers: Sit to/from Stand Sit to Stand: Supervision         General transfer comment: Supervision for safety. Cues for safe hand placement.   Ambulation/Gait Ambulation/Gait assistance: Supervision Gait Distance (Feet): 400 Feet Assistive device: Rolling walker (2 wheeled) Gait Pattern/deviations: Step-through  pattern;Decreased stride length;Trunk flexed Gait velocity: Decreased   General Gait Details: Overall steady gait with use of RW. required cues for upright posture and proximity to device. Educated about generalized walking program to perform at home.   Stairs            Wheelchair Mobility    Modified Rankin (Stroke Patients Only)       Balance Overall balance assessment: No apparent balance deficits (not formally assessed)                                           Pertinent Vitals/Pain Pain Assessment: 0-10 Pain Score: 6  Pain Location: back Pain Descriptors / Indicators: Aching;Operative site guarding Pain Intervention(s): Limited activity within patient's tolerance;Monitored during session;Repositioned    Home Living Family/patient expects to be discharged to:: Private residence Living Arrangements: Spouse/significant other Available Help at Discharge: Family;Available 24 hours/day Type of Home: House Home Access: Ramped entrance     Home Layout: One level Home Equipment: Walker - 2 wheels;Walker - 4 wheels;Cane - single point;Bedside commode      Prior Function Level of Independence: Independent               Hand Dominance        Extremity/Trunk Assessment   Upper Extremity Assessment Upper Extremity Assessment: Defer to OT evaluation    Lower Extremity Assessment Lower Extremity Assessment: Generalized weakness    Cervical / Trunk Assessment Cervical / Trunk Assessment: Other exceptions Cervical / Trunk Exceptions: s/p lumbar surgery  Communication   Communication: No difficulties  Cognition Arousal/Alertness: Awake/alert Behavior During Therapy: WFL for tasks assessed/performed Overall  Cognitive Status: Within Functional Limits for tasks assessed                                        General Comments General comments (skin integrity, edema, etc.): Educated about car transfer technique.      Exercises     Assessment/Plan    PT Assessment Patent does not need any further PT services  PT Problem List         PT Treatment Interventions      PT Goals (Current goals can be found in the Care Plan section)  Acute Rehab PT Goals Patient Stated Goal: to go home today PT Goal Formulation: With patient Time For Goal Achievement: 07/23/19 Potential to Achieve Goals: Good    Frequency     Barriers to discharge        Co-evaluation               AM-PAC PT "6 Clicks" Mobility  Outcome Measure Help needed turning from your back to your side while in a flat bed without using bedrails?: None Help needed moving from lying on your back to sitting on the side of a flat bed without using bedrails?: None Help needed moving to and from a bed to a chair (including a wheelchair)?: None Help needed standing up from a chair using your arms (e.g., wheelchair or bedside chair)?: None Help needed to walk in hospital room?: None Help needed climbing 3-5 steps with a railing? : A Little 6 Click Score: 23    End of Session Equipment Utilized During Treatment: Gait belt;Back brace Activity Tolerance: Patient tolerated treatment well Patient left: in bed;with call bell/phone within reach(sitting EOB ) Nurse Communication: Mobility status PT Visit Diagnosis: Other abnormalities of gait and mobility (R26.89)    Time: [id number] PT Time Calculation (min) (ACUTE ONLY): 12 min   Charges:   PT Evaluation $PT Eval Low Complexity: 1 Low          Nicholas Mason, PT, DPT  Acute Rehabilitation Services  Pager: [phone] Office: [phone]   Nicholas Mason 07/23/2019, 9:15 AM

## 2019-07-24 NOTE — Anesthesia Postprocedure Evaluation (Signed)
Anesthesia Post Note  Patient: Macklen Wilhoite  Procedure(s) Performed: Posterior Lumbar Interbody Fusion - Lumbar Three-Lumbar Four, removal Lumbar Four- Five (N/A Back)     Patient location during evaluation: PACU Anesthesia Type: General Level of consciousness: awake and alert Pain management: pain level controlled Vital Signs Assessment: post-procedure vital signs reviewed and stable Respiratory status: spontaneous breathing, nonlabored ventilation, respiratory function stable and patient connected to nasal cannula oxygen Cardiovascular status: blood pressure returned to baseline and stable Postop Assessment: no apparent nausea or vomiting Anesthetic complications: no    Last Vitals:  Vitals:   07/23/19 0428 07/23/19 0716  BP: 134/71 124/66  Pulse: 73 71  Resp: 18 16  Temp: 36.6 C 36.8 C  SpO2:  97%    Last Pain:  Vitals:   07/23/19 0716  TempSrc: Oral  PainSc:                  Maanvi Lecompte

## 2019-07-26 MED FILL — Heparin Sodium (Porcine) Inj 1000 Unit/ML: INTRAMUSCULAR | Qty: 30 | Status: AC

## 2019-07-26 MED FILL — Sodium Chloride IV Soln 0.9%: INTRAVENOUS | Qty: 2000 | Status: AC

## 2020-06-16 IMAGING — RF DG C-ARM 1-60 MIN
1 series · 2 of 2 positions shown · non-contrast
Comparison: Intraoperative images dated 03/12/2018

CLINICAL DATA: Spinal stenosis. Lumbar fusion.

EXAM:
LUMBAR SPINE - 2-3 VIEW; DG C-ARM 1-60 MIN

[Series 1: run · 2 of 2 slices shown]
[im 1/2]
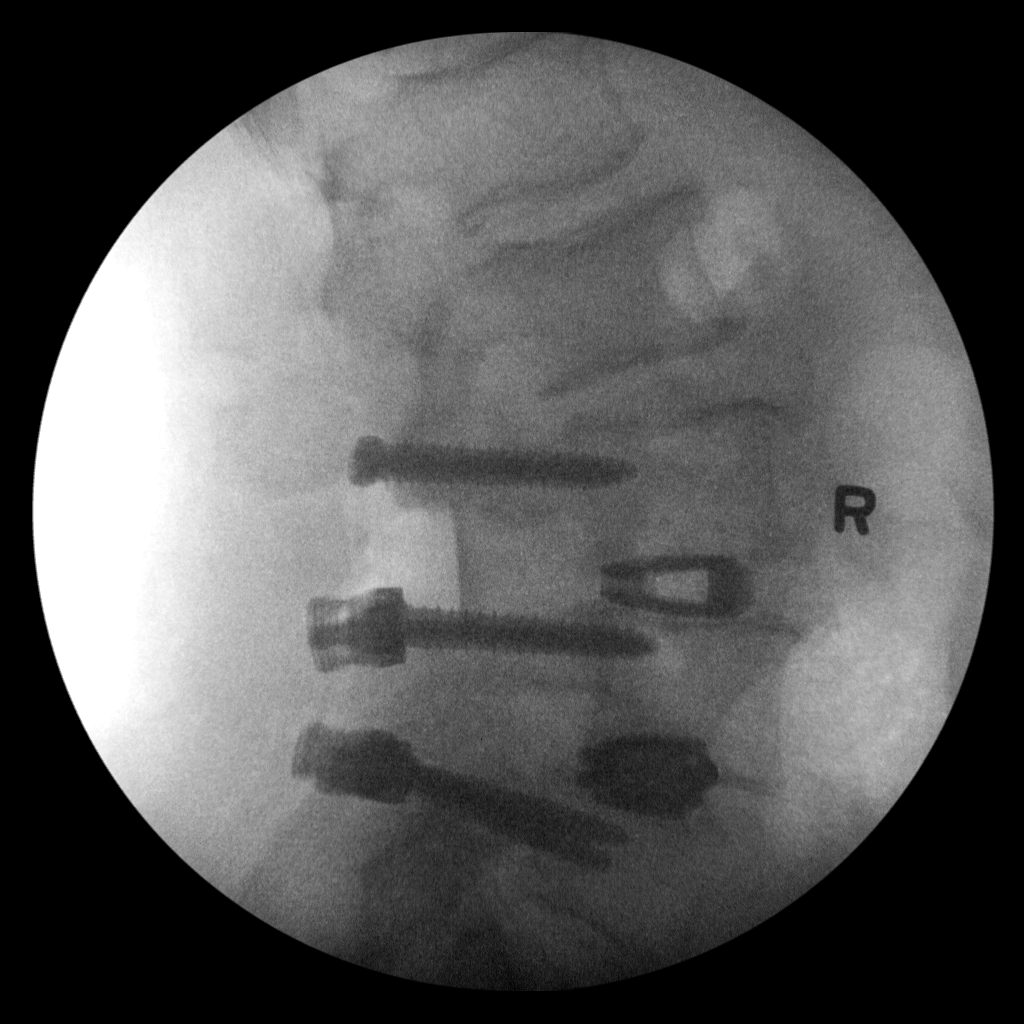
[im 2/2]
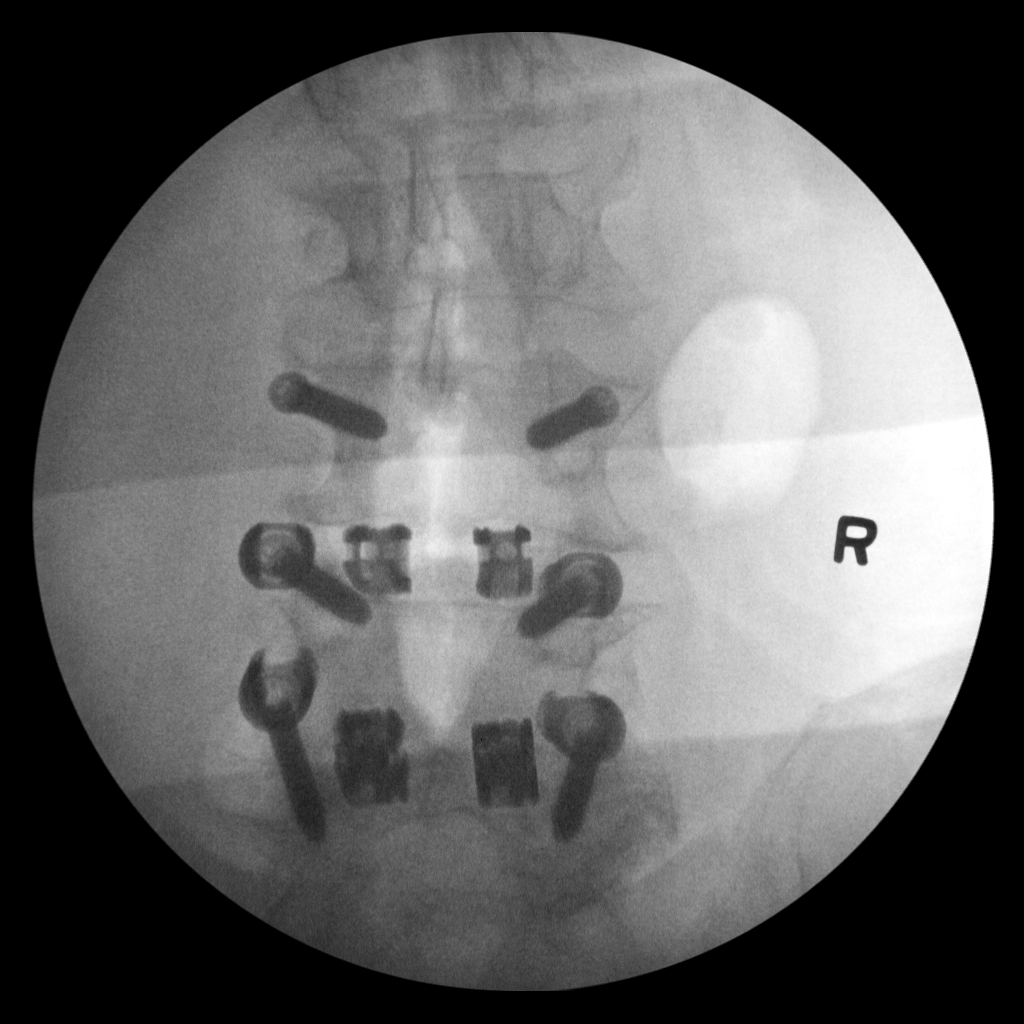

[2 of 2 positions shown; findings below may reference images not displayed]

FINDINGS: AP and lateral C-arm images demonstrate the patient undergoing
posterior and interbody fusion at L3-4. Hardware appears in good
position in the AP and lateral projections. Lateral alignment
appears normal.
IMPRESSION: Fusion performed at L3-4.

FLUOROSCOPY TIME:  29 seconds

C-arm fluoroscopic images were obtained intraoperatively and
submitted for post operative interpretation.

## 2020-06-16 IMAGING — RF DG LUMBAR SPINE 2-3V
1 series · 2 of 2 positions shown · non-contrast
Comparison: Intraoperative images dated 03/12/2018

CLINICAL DATA: Spinal stenosis. Lumbar fusion.

EXAM:
LUMBAR SPINE - 2-3 VIEW; DG C-ARM 1-60 MIN

[Series 1: run · 2 of 2 slices shown]
[im 1/2]
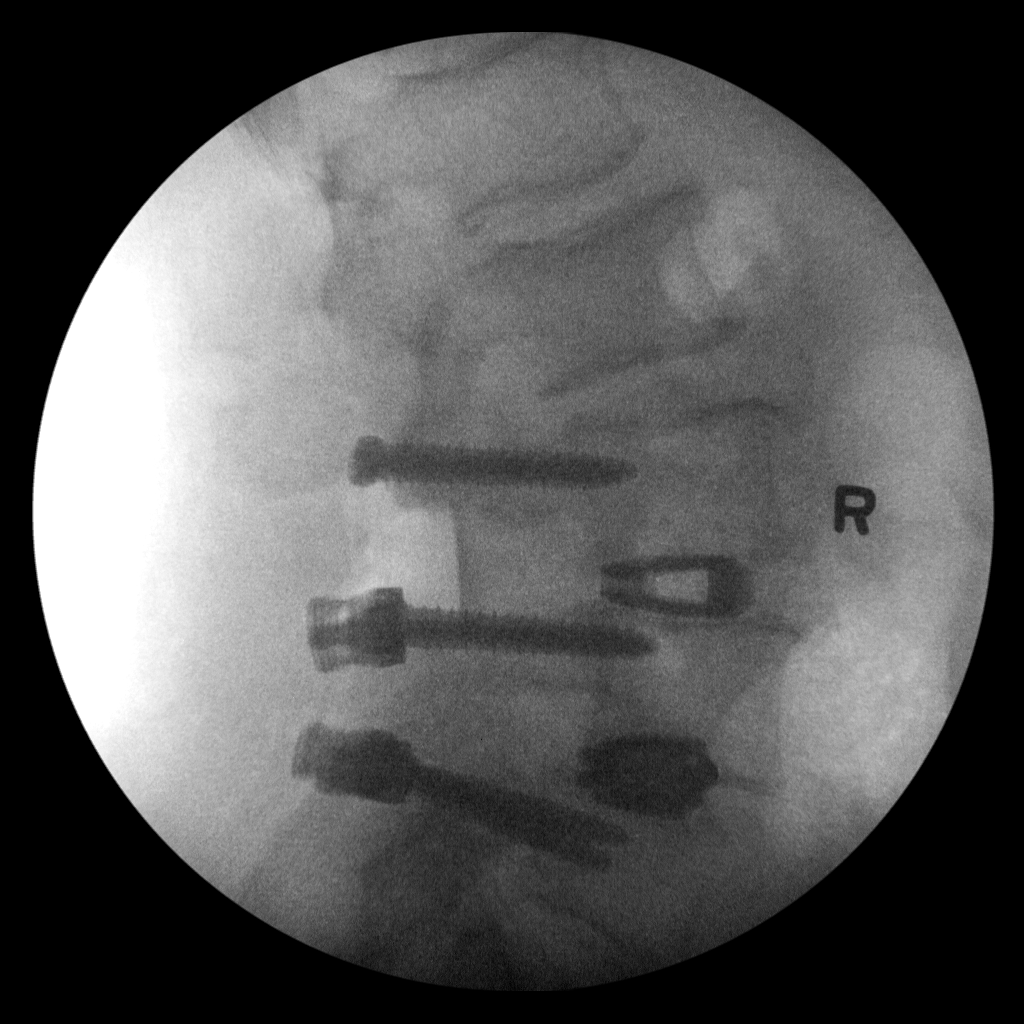
[im 2/2]
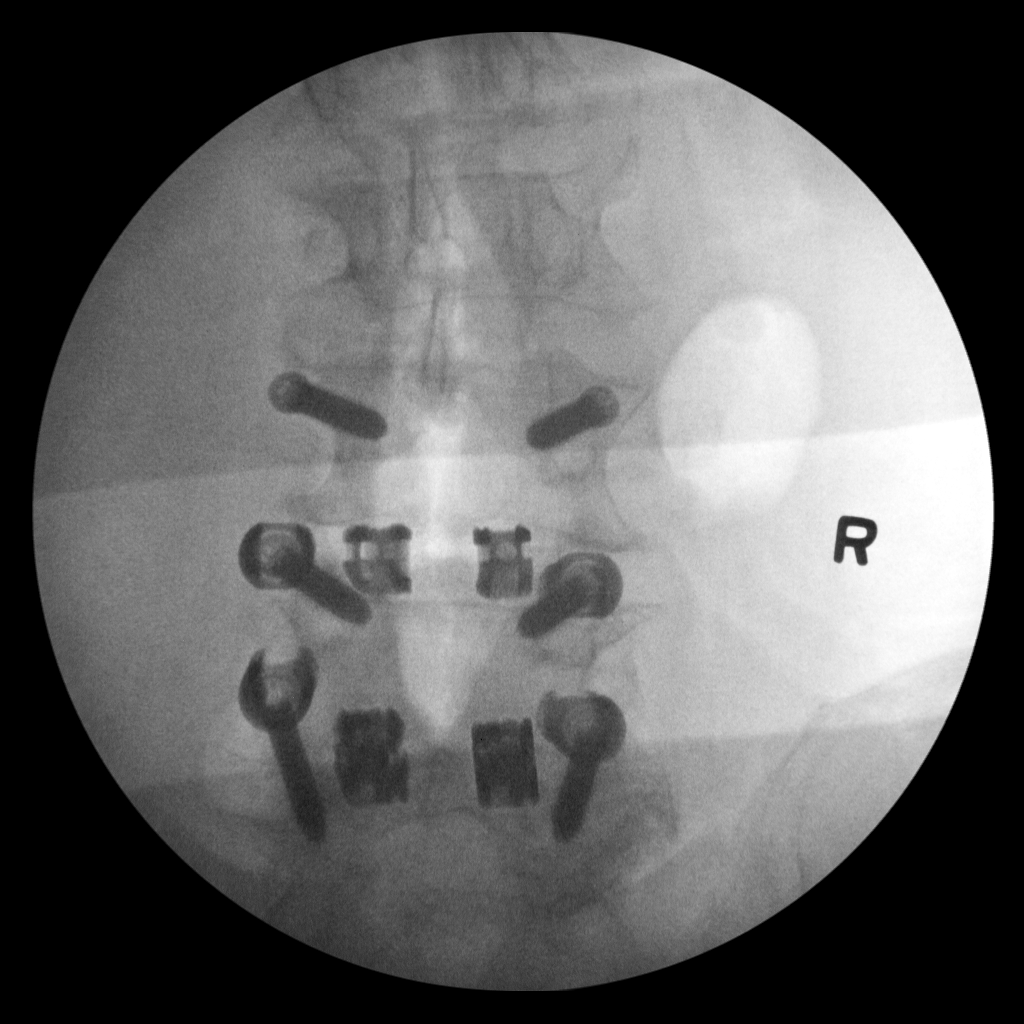

[2 of 2 positions shown; findings below may reference images not displayed]

FINDINGS: AP and lateral C-arm images demonstrate the patient undergoing
posterior and interbody fusion at L3-4. Hardware appears in good
position in the AP and lateral projections. Lateral alignment
appears normal.
IMPRESSION: Fusion performed at L3-4.

FLUOROSCOPY TIME:  29 seconds

C-arm fluoroscopic images were obtained intraoperatively and
submitted for post operative interpretation.
